# Patient Record
Sex: Male | Born: 1940 | Hispanic: No | Marital: Single | Smoking: Former smoker
Health system: Southern US, Community
[De-identification: ages and names within clinical notes are randomized; demographics above are authoritative.]

## PROBLEM LIST (undated history)

## (undated) DIAGNOSIS — M109 Gout, unspecified: Secondary | ICD-10-CM

## (undated) DIAGNOSIS — I10 Essential (primary) hypertension: Secondary | ICD-10-CM

---

## 2007-01-20 ENCOUNTER — Ambulatory Visit: Payer: Self-pay | Admitting: Family Medicine

## 2009-10-14 ENCOUNTER — Emergency Department (HOSPITAL_COMMUNITY): Admission: EM | Admit: 2009-10-14 | Discharge: 2009-10-14 | Payer: Self-pay | Admitting: Family Medicine

## 2011-04-13 ENCOUNTER — Inpatient Hospital Stay (HOSPITAL_COMMUNITY)
Admission: EM | Admit: 2011-04-13 | Discharge: 2011-04-16 | DRG: 065 | Disposition: A | Discharge status: Home / Self Care | Payer: Medicaid Other | Source: Ambulatory Visit | Attending: Family Medicine | Admitting: Family Medicine

## 2011-04-13 ENCOUNTER — Emergency Department (HOSPITAL_COMMUNITY): Payer: Medicaid Other

## 2011-04-13 ENCOUNTER — Encounter: Payer: Self-pay | Admitting: Family Medicine

## 2011-04-13 DIAGNOSIS — E785 Hyperlipidemia, unspecified: Secondary | ICD-10-CM

## 2011-04-13 DIAGNOSIS — I6350 Cerebral infarction due to unspecified occlusion or stenosis of unspecified cerebral artery: Secondary | ICD-10-CM

## 2011-04-13 DIAGNOSIS — H9190 Unspecified hearing loss, unspecified ear: Secondary | ICD-10-CM | POA: Diagnosis present

## 2011-04-13 DIAGNOSIS — Z66 Do not resuscitate: Secondary | ICD-10-CM | POA: Diagnosis present

## 2011-04-13 DIAGNOSIS — N179 Acute kidney failure, unspecified: Secondary | ICD-10-CM | POA: Diagnosis present

## 2011-04-13 DIAGNOSIS — I10 Essential (primary) hypertension: Secondary | ICD-10-CM

## 2011-04-13 DIAGNOSIS — F17200 Nicotine dependence, unspecified, uncomplicated: Secondary | ICD-10-CM

## 2011-04-13 LAB — PROTIME-INR: Prothrombin Time: 12.2 seconds (ref 11.6–15.2)

## 2011-04-13 LAB — COMPREHENSIVE METABOLIC PANEL
CO2: 27 mEq/L (ref 19–32)
Calcium: 8.7 mg/dL (ref 8.4–10.5)
Creatinine, Ser: 1.44 mg/dL — ABNORMAL HIGH (ref 0.50–1.35)
GFR calc Af Amer: 59 mL/min — ABNORMAL LOW (ref >60)
GFR calc non Af Amer: 49 mL/min — ABNORMAL LOW (ref >60)
Glucose, Bld: 139 mg/dL — ABNORMAL HIGH (ref 70–99)
Sodium: 138 mEq/L (ref 135–145)
Total Protein: 6.4 g/dL (ref 6.0–8.3)

## 2011-04-13 LAB — CBC
HCT: 41.6 % (ref 39.0–52.0)
Hemoglobin: 14.8 g/dL (ref 13.0–17.0)
MCHC: 35.6 g/dL (ref 30.0–36.0)
RDW: 13.6 % (ref 11.5–15.5)
WBC: 14.3 10*3/uL — ABNORMAL HIGH (ref 4.0–10.5)

## 2011-04-13 LAB — DIFFERENTIAL
Basophils Absolute: 0 10*3/uL (ref 0.0–0.1)
Basophils Relative: 0 % (ref 0–1)
Lymphocytes Relative: 21 % (ref 12–46)
Monocytes Absolute: 0.8 10*3/uL (ref 0.1–1.0)
Neutro Abs: 10.4 10*3/uL — ABNORMAL HIGH (ref 1.7–7.7)
Neutrophils Relative %: 73 % (ref 43–77)

## 2011-04-13 LAB — URINE MICROSCOPIC-ADD ON

## 2011-04-13 LAB — URINALYSIS, ROUTINE W REFLEX MICROSCOPIC
Leukocytes, UA: NEGATIVE
Nitrite: NEGATIVE
Specific Gravity, Urine: 1.011 (ref 1.005–1.030)
Urobilinogen, UA: 0.2 mg/dL (ref 0.0–1.0)
pH: 5.5 (ref 5.0–8.0)

## 2011-04-13 LAB — GLUCOSE, CAPILLARY: Glucose-Capillary: 119 mg/dL — ABNORMAL HIGH (ref 70–99)

## 2011-04-13 LAB — POCT I-STAT TROPONIN I: Troponin i, poc: 0 ng/mL (ref 0.00–0.08)

## 2011-04-13 NOTE — H&P (Signed)
Tim [name] is an 70 y.o. male.    PCP:  [facility]  Chief Complaint: R sided weakness x 2 days.  HPI: 70 yo M with known history of HTN presents with 2 days of R sided weakness and numbness. His symptoms have gradually improved, but he still has noticeable weakness. He also reports two months of moderate to severe L sided headache and L hypoacusis. He has been evaluated by his PCP and was recently started on a 6 day course of 60 mg of PO prednisone.  When he weakness started he rested in bed for a while, but presented to the ED for worsening R sided HA and dizziness when he stands. He denies tinnitus, visual disturbances , jaw pain, CP, SOB, N/V, pain with urination, constipation or diarrhea.   PMHx:  HTN Allergies    PSHx: none   FamHx: non-contributory   Social History: Lives with 2 sons, wife in [place]. Smokes 1/2 ppd x 54 yrs. Denies ETOH and IDU.  SonHercules [name]: [phone].  Allergies: NKDA  Meds:    ROS As per HPI, otherwise negative.    Physical Exam  Vitals: 98.2, 148/75 --> 165/95, 98, 21, 98-100% on RA. GEN: Awake alert and NAD HEENT: NCAT, PERRLA, Fundoscopic exam + RR bilaterally, no scleral icterus, b/l TM WNL, L external auditory canal with increased injection but no hyperemia. Nares clear. Poor dentition, MMM, oropharynx clear. Uvula midline.  UJ:WJX9, RRR, no MRGs, 2+ radial and DP pulses  Resp:Normal WOB, CTA b/l.  ABD: Flat, NABS, soft, NT/ND EXT: Warm and dry, no edema  NEURO: A&O x 3, CN II-XII intact except for slight tongue deviation to the R and decreasing hearing (whisper test) in L ear. Normal strength, tone sensation on L side. Decrease strength and sensation on R. 2+ DTR b/l brachioradialis and patellar. No clonus. Romberg and gait testing deferred.   Results for orders placed during the hospital encounter of 04/13/11 (from the past 48 hour(s))  URINALYSIS, ROUTINE W REFLEX MICROSCOPIC     Status: Abnormal   Collection Time   04/13/11  3:17 PM      Component Value Range Comment   Color, Urine YELLOW  YELLOW     Appearance CLEAR  CLEAR     Specific Gravity, Urine 1.011  1.005 - 1.030     pH 5.5  5.0 - 8.0     Glucose, UA NEGATIVE  NEGATIVE (mg/dL)    Hgb urine dipstick NEGATIVE  NEGATIVE     Bilirubin Urine NEGATIVE  NEGATIVE     Ketones, ur NEGATIVE  NEGATIVE (mg/dL)    Protein, ur 147 (*) NEGATIVE (mg/dL)    Urobilinogen, UA 0.2  0.0 - 1.0 (mg/dL)    Nitrite NEGATIVE  NEGATIVE     Leukocytes, UA NEGATIVE  NEGATIVE    URINE MICROSCOPIC-ADD ON     Status: Abnormal   Collection Time   04/13/11  3:17 PM      Component Value Range Comment   Squamous Epithelial / LPF RARE  RARE     WBC, UA 0-2  <3 (WBC/hpf)    RBC / HPF 0-2  <3 (RBC/hpf)    Casts GRANULAR CAST (*) NEGATIVE    DIFFERENTIAL     Status: Abnormal   Collection Time   04/13/11  3:19 PM      Component Value Range Comment   Neutrophils Relative 73  43 - 77 (%)    Neutro Abs 10.4 (*) 1.7 - 7.7 (K/uL)  Lymphocytes Relative 21  12 - 46 (%)    Lymphs Abs 2.9  0.7 - 4.0 (K/uL)    Monocytes Relative 5  3 - 12 (%)    Monocytes Absolute 0.8  0.1 - 1.0 (K/uL)    Eosinophils Relative 1  0 - 5 (%)    Eosinophils Absolute 0.1  0.0 - 0.7 (K/uL)    Basophils Relative 0  0 - 1 (%)    Basophils Absolute 0.0  0.0 - 0.1 (K/uL)   CBC     Status: Abnormal   Collection Time   04/13/11  3:19 PM      Component Value Range Comment   WBC 14.3 (*) 4.0 - 10.5 (K/uL)    RBC 4.81  4.22 - 5.81 (MIL/uL)    Hemoglobin 14.8  13.0 - 17.0 (g/dL)    HCT 16.1  09.6 - 04.5 (%)    MCV 86.5  78.0 - 100.0 (fL)    MCH 30.8  26.0 - 34.0 (pg)    MCHC 35.6  30.0 - 36.0 (g/dL)    RDW 40.9  81.1 - 91.4 (%)    Platelets 314  150 - 400 (K/uL)   PROTIME-INR     Status: Normal   Collection Time   04/13/11  3:19 PM      Component Value Range Comment   Prothrombin Time 12.2  11.6 - 15.2 (seconds)    INR 0.89  0.00 - 1.49    COMPREHENSIVE METABOLIC PANEL     Status: Abnormal    Collection Time   04/13/11  3:19 PM      Component Value Range Comment   Sodium 138  135 - 145 (mEq/L)    Potassium 3.2 (*) 3.5 - 5.1 (mEq/L)    Chloride 101  96 - 112 (mEq/L)    CO2 27  19 - 32 (mEq/L)    Glucose, Bld 139 (*) 70 - 99 (mg/dL)    BUN 31 (*) 6 - 23 (mg/dL)    Creatinine, Ser 7.82 (*) 0.50 - 1.35 (mg/dL)    Calcium 8.7  8.4 - 10.5 (mg/dL)    Total Protein 6.4  6.0 - 8.3 (g/dL)    Albumin 3.1 (*) 3.5 - 5.2 (g/dL)    AST 21  0 - 37 (U/L)    ALT 43  0 - 53 (U/L)    Alkaline Phosphatase 73  39 - 117 (U/L)    Total Bilirubin 0.6  0.3 - 1.2 (mg/dL)    GFR calc non Af Amer 49 (*) >60 (mL/min)    GFR calc Af Amer 59 (*) >60 (mL/min)   POCT I-STAT TROPONIN I     Status: Normal   Collection Time   04/13/11  3:50 PM      Component Value Range Comment   Troponin i, poc 0.00  0.00 - 0.08 (ng/mL)    Comment 3            GLUCOSE, CAPILLARY     Status: Abnormal   Collection Time   04/13/11  4:14 PM      Component Value Range Comment   Glucose-Capillary 119 (*) 70 - 99 (mg/dL)   CK TOTAL AND CKMB     Status: Normal   Collection Time   04/13/11  8:29 PM      Component Value Range Comment   Total CK 65  7 - 232 (U/L)    CK, MB 2.9  0.3 - 4.0 (ng/mL)    Relative Index RELATIVE  INDEX IS INVALID  0.0 - 2.5    TROPONIN I     Status: Normal   Collection Time   04/13/11  8:29 PM      Component Value Range Comment   Troponin I <0.30  <0.30 (ng/mL)    Dg Chest 2 View  04/13/2011  *RADIOLOGY REPORT*  Clinical Data: Chest pain, weakness.  CHEST - 2 VIEW  Comparison: None  Findings: Minimal bibasilar atelectasis.  Otherwise lungs are clear.  Heart is normal size.  No effusions.  No acute bony abnormality.  IMPRESSION: Bibasilar atelectasis.  Original Report Authenticated By: Cyndie Chime, M.D.   Ct Head Wo Contrast  04/13/2011  *RADIOLOGY REPORT*  Clinical Data: Headaches with left-sided weakness.  History of hypertension.  CT HEAD WITHOUT CONTRAST  Technique:  Contiguous axial images  were obtained from the base of the skull through the vertex without contrast.  Comparison: None.  Findings: There is no evidence of acute intracranial hemorrhage, mass lesion, brain edema or extra-axial fluid collection.  There is well defined low density in the left basal ganglia consistent with old chronic lacunar infarcts.  In addition, smaller less well defined foci of low density are seen within the left basal ganglia and thalamus (images 12 and 13). There is no evidence of cortical infarct or hydrocephalus.  The visualized paranasal sinuses are clear.  The calvarium is intact.  IMPRESSION:  1.  Multifocal small vessel ischemic changes are present within the left basal ganglia and thalamus.  Some of these are ill-defined and potentially subacute - correlate clinically. 2.  No evidence of cortical infarct, brain edema or acute hemorrhage.  Original Report Authenticated By: Gerrianne Scale, M.D.    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  1. Stroke: admit for work-up. ASA for anti-platelet agent. Will check A1c and FLP to guide secondary prevention strategies. Evaluate clot burden/vascular obstruction with cardiac ECHO and carotid doppler. Have considered obtaining an MRI to further evaluate brainstem for lesions/evidence of ischemia given complaint of unilateral hearing loss and dizziness.   2. Renal insufficiency: Cr elevated. No previous labs to trend. Will hydrate and recheck in AM.   3. HTN: Elevated BP. Will restart home HCTz-lisinopril. Will not treat BP aggressively given recent neurological ischemia.   4. FEN/GI: K+ low. Will replete in IVFs. Pt has passed bedside swallow study and may have a heart healthy diet.   5.  DVT PPX: Lovenox  6. Dispo: pending stroke work-up to home. Anticipate 2-3 day hospital stay.   Alanmichael Barmore 04/13/2011, 11:21 PM

## 2011-04-14 ENCOUNTER — Inpatient Hospital Stay (HOSPITAL_COMMUNITY): Payer: Medicaid Other

## 2011-04-14 LAB — DIFFERENTIAL
Eosinophils Absolute: 0.3 10*3/uL (ref 0.0–0.7)
Lymphs Abs: 3 10*3/uL (ref 0.7–4.0)
Monocytes Absolute: 0.7 10*3/uL (ref 0.1–1.0)
Monocytes Relative: 6 % (ref 3–12)
Neutrophils Relative %: 68 % (ref 43–77)

## 2011-04-14 LAB — BASIC METABOLIC PANEL
BUN: 33 mg/dL — ABNORMAL HIGH (ref 6–23)
GFR calc Af Amer: >60 mL/min (ref >60)
GFR calc non Af Amer: 51 mL/min — ABNORMAL LOW (ref >60)
Potassium: 4.3 mEq/L (ref 3.5–5.1)
Sodium: 143 mEq/L (ref 135–145)

## 2011-04-14 LAB — CBC
Hemoglobin: 13.3 g/dL (ref 13.0–17.0)
MCH: 30.4 pg (ref 26.0–34.0)
MCV: 87.9 fL (ref 78.0–100.0)
Platelets: 301 10*3/uL (ref 150–400)
RBC: 4.37 MIL/uL (ref 4.22–5.81)

## 2011-04-14 LAB — LIPID PANEL
Cholesterol: 221 mg/dL — ABNORMAL HIGH (ref 0–200)
HDL: 47 mg/dL (ref >39)
Triglycerides: 527 mg/dL — ABNORMAL HIGH (ref <150)

## 2011-04-14 LAB — HEMOGLOBIN A1C
Hgb A1c MFr Bld: 6 % — ABNORMAL HIGH (ref <5.7)
Mean Plasma Glucose: 126 mg/dL — ABNORMAL HIGH (ref <117)

## 2011-04-14 LAB — GLUCOSE, CAPILLARY: Glucose-Capillary: 93 mg/dL (ref 70–99)

## 2011-04-15 DIAGNOSIS — G459 Transient cerebral ischemic attack, unspecified: Secondary | ICD-10-CM

## 2011-04-15 LAB — CBC
HCT: 40.4 % (ref 39.0–52.0)
MCV: 88 fL (ref 78.0–100.0)
RBC: 4.59 MIL/uL (ref 4.22–5.81)
WBC: 12.6 10*3/uL — ABNORMAL HIGH (ref 4.0–10.5)

## 2011-04-15 LAB — GLUCOSE, CAPILLARY
Glucose-Capillary: 117 mg/dL — ABNORMAL HIGH (ref 70–99)
Glucose-Capillary: 139 mg/dL — ABNORMAL HIGH (ref 70–99)

## 2011-04-15 LAB — BASIC METABOLIC PANEL
Chloride: 105 mEq/L (ref 96–112)
Creatinine, Ser: 1.3 mg/dL (ref 0.50–1.35)
GFR calc Af Amer: >60 mL/min (ref >60)

## 2011-04-15 LAB — LIPID PANEL
Cholesterol: 241 mg/dL — ABNORMAL HIGH (ref 0–200)
Total CHOL/HDL Ratio: 5 RATIO

## 2011-04-16 LAB — CBC
MCHC: 33.7 g/dL (ref 30.0–36.0)
Platelets: 289 10*3/uL (ref 150–400)
RDW: 14.1 % (ref 11.5–15.5)

## 2011-04-16 LAB — BASIC METABOLIC PANEL
GFR calc Af Amer: 50 mL/min — ABNORMAL LOW (ref >60)
GFR calc non Af Amer: 41 mL/min — ABNORMAL LOW (ref >60)
Potassium: 4.1 mEq/L (ref 3.5–5.1)
Sodium: 140 mEq/L (ref 135–145)

## 2011-04-16 LAB — GLUCOSE, CAPILLARY: Glucose-Capillary: 107 mg/dL — ABNORMAL HIGH (ref 70–99)

## 2011-04-20 ENCOUNTER — Other Ambulatory Visit: Payer: Self-pay | Admitting: Otolaryngology

## 2011-04-24 NOTE — H&P (Signed)
NAMEHAFIZ, Tim [name]                     ACCOUNT NO.:  [id number]  MEDICAL RECORD NO.:  [id number]  LOCATION:  3038                         FACILITY:  [facility]  PHYSICIAN:  Kamica Florance A. Sheffield Slider, M.D.    DATE OF BIRTH:  1941-06-01  DATE OF ADMISSION:  04/13/2011 DATE OF DISCHARGE:                             HISTORY & PHYSICAL   PRIMARY CARE PHYSICIAN:  [facility].  CHIEF COMPLAINT:  Right-sided weakness x2 days.  HISTORY OF PRESENT ILLNESS:  The patient is a 70 year old male with a known history of hypertension who presents with 2 days of right-sided weakness and numbness.  His symptoms have gradually improved, but he still has noticeable weakness on his right side.  He also reports 2 months of moderate-to-severe left-sided headache and left hypacusis.  He has been evaluated by his PCP who has recently started on a 6-day course of 50 mg prednisone.  On April 04, 2011, when his weakness started, he rested on the bed for a while at home, but presented to the ED today. He has worsening right-sided headache and dizziness when he stands.  He denies tinnitus, visual disturbances, jaw pain, chest pain, shortness of breath, nausea, and vomiting, pain with urination, constipation, or diarrhea.  PAST MEDICAL HISTORY:  Significant for hypertension and allergy.  PAST SURGICAL HISTORY:  None.  FAMILY HISTORY:  Noncontributory.  SOCIAL HISTORY:  The patient lives with his 2 sons.  His wife is still at [place].  He smokes a half pack per day x45 years.  He denies EtOH and illicit drug use.  His son, Isaack Preble, left his cell phone number and would like to be updated.  His number is [phone].  The patient is [place] and he speaks Counselling psychologist.  ALLERGIES:  No known drug allergies.  MEDICATIONS: 1. Hydrochlorothiazide/lisinopril 12.5/10 mg p.o. daily. 2. Claritin daily. 3. Prednisone 50 mg p.o. daily x6 days completed a course on April 11, 2011.  REVIEW OF SYSTEMS:   As per HPI, otherwise negative.  PHYSICAL EXAMINATION:  VITAL SIGNS:  98.2, blood pressure 140/75 to 155/95, pulse 98, respiratory rate 21, O2 sats 98-100% on room air. GENERAL:  The patient is awake, alert, in no acute distress. HEENT:  NCAT, PERRLA, funduscopic exam positive red reflex bilaterally. No papilledema.  No scleral icterus.  Bilateral tympanic membranes within normal limits.  Left external auditory canal with increased injection but no hyperemia.  Nares clear.  Poor dentition.  Moist mucous membranes.  Oropharynx clear.  Uvula midline. CARDIOVASCULAR:  S1, S2.  Regular rate and rhythm.  No murmurs, rubs, or gallops.  2+ radial and DP pulses. RESPIRATORY:  Normal work of breathing.  Clear to auscultation bilaterally. ABDOMEN:  Flat, normoactive bowel sounds.  Soft, nontender, nondistended. EXTREMITIES:  Warm and dry.  No edema. NEURO:  Alert and oriented x3.  Cranial nerves II-XII intact except for slight tongue deviation to the right and decreased hearing as evaluated by whisper test in his left ear.  Normal strength, tone, sensation on left side.  Decreased strength and sensation on the right.  2+ DTRs in bilateral brachioradialis and patellar.  No clonus.  Romberg and gait testing deferred.  PERTINENT LABORATORY DATA:  UA within normal limits except for 100 protein.  Urine micro shows rare squamous cells and granular casts. CBC:  White count 14.3, hemoglobin 14.8, hematocrit 41.6, platelets 314. INR 0.89.  BMET:  Potassium low at 3.2, albumin 3.1, glucose is 139, BUN 31, creatinine elevated at 1.44.  Point-of-care cardiac enzymes negative.  Chest x-ray shows bibasilar atelectasis, otherwise normal. CT of the head positive for multifocal small vessel ischemic changes in the left basal ganglia and thalamus.  Some of the areas are ill defined and potentially subacute.  No evidence of cortical infarct, brain edema, or acute hemorrhage.  ASSESSMENT AND PLAN:  70 year old  gentleman with left-sided basal ganglia and thalamic stroke. 1. Stroke.  Admit for workup.  Aspirin for antiplatelet agent.  We     will check A1c and fasting lipid panel secondary prevention strategies.     Evaluate clot burden/vascular obstruction with cardiac echo and     carotid Doppler.  I have also considered obtaining an MRI to     further evaluate brainstem for lesions.  There was evidence of     ischemia given complaint of unilateral hearing loss and dizziness     during this admission. 2. Renal insufficiency.  Creatinine elevated.  No previous labs to     trend.  We will hydrate and recheck creatinine in the morning. 3. Hypertension.  Elevated blood pressure.  We will restart home     hydrochlorothiazide/lisinopril.  We will not treat blood pressure     aggressively given recent neurological ischemia. 4. Fluids, electrolytes, nutrition/gastrointestinal.  Potassium low.     We will replete and give IV fluids.  The patient has passed bedside     swallow study and may have a heart-healthy diet. 5. Deep vein thrombosis prophylaxis.  Lovenox subcu. 6. Disposition.  Pending stroke workup, to home, anticipate 2- to 3-     day hospital stay.    ______________________________ Dessa Phi, MD   ______________________________ Arnette Norris. Sheffield Slider, M.D.    JF/MEDQ  D:  04/14/2011  T:  04/14/2011  Job:  [id number]  Electronically Signed by Dessa Phi MD on 04/15/2011 11:28:34 PM Electronically Signed by Zachery Dauer M.D. on 04/24/2011 05:14:39 AM

## 2011-04-25 ENCOUNTER — Ambulatory Visit: Payer: Medicaid Other | Attending: Orthopedic Surgery | Admitting: Rehabilitative and Restorative Service Providers"

## 2011-04-25 DIAGNOSIS — R293 Abnormal posture: Secondary | ICD-10-CM | POA: Insufficient documentation

## 2011-04-25 DIAGNOSIS — M25619 Stiffness of unspecified shoulder, not elsewhere classified: Secondary | ICD-10-CM | POA: Insufficient documentation

## 2011-04-25 DIAGNOSIS — IMO0001 Reserved for inherently not codable concepts without codable children: Secondary | ICD-10-CM | POA: Insufficient documentation

## 2011-04-25 DIAGNOSIS — M25519 Pain in unspecified shoulder: Secondary | ICD-10-CM | POA: Insufficient documentation

## 2011-05-09 NOTE — Discharge Summary (Signed)
Tim [name], Tim [name]                     ACCOUNT NO.:  [id number]  MEDICAL RECORD NO.:  [id number]  LOCATION:  3038                         FACILITY:  [facility]  PHYSICIAN:  Lavonne Kinderman A. Sheffield Slider, M.D.    DATE OF BIRTH:  1941/01/15  DATE OF ADMISSION:  04/13/2011 DATE OF DISCHARGE:  04/16/2011                              DISCHARGE SUMMARY   PRIMARY CARE PHYSICIAN:  [facility] in [place].  SPECIALIST:  Otolaryngologist, Suzanna Obey, MD, at [facility].  CONSULTATIONS DURING INPATIENT STAY:  Physical Therapy, Occupational Therapy, and Speech Pathology.  PROCEDURES:  None.  ADMISSION DIAGNOSIS:  Acute onset right-sided weakness.  DISCHARGE DIAGNOSES: 1. Cerebrovascular attack - acute infarct of the left posterior limb     of the internal capsule. 2. Hypertension. 3. Left-sided hearing loss. 4. Hyperlipidemia.  HOSPITAL COURSE: 1. Mr. Cull is a 70 year old male with past medical history of     hypertension and tobacco use, who presented with severe left-sided     headache and 2-day history of right-sided weakness.  Additionally,     he had left-sided hearing loss of 2 months' duration.  Given the     acute onset of right-sided weakness, the patient was admitted for a     stroke workup and he was started on antiplatelet therapy and     aspirin 325 mg.  A CT of the head performed on the day of admission     showed multifocal small-vessel ischemic changes present within the     left basal ganglia and thalamus.  There was no evidence of cortical     infarct, brain edema, or acute hemorrhage.  However, an MRI on day     #2 of admission showed an acute infarct of the posterior limb of     the internal capsule.  There was a chronic infarct of the left     external capsule anteriorly as well as chronic lacunar infarcts in     the left putamen.  Brainstem and cerebellum were intact, negative     for hemorrhage, mass, or lesions.  At the same time, an MR     angiogram was  performed that was negative for aneurysm.  For     further stroke workup, a carotid ultrasound was performed and     showed no stenosis.  Additionally, echocardiogram was performed     that was negative for any valvular abnormalities or thrombus.  By     day #2 of admission, the patient's right-sided weakness that     started to resolve, however, the patient continued to note     decreased left-sided hearing as well as severe left-sided headache.     The patient was seen by Speech Pathology.  It was noted that he     passed the swallowing study, however, he had poor hearing in his     left ear and recommended a consultation with Audiology.  The     patient was seen by Occupational Therapy and did not think that he     needed any occupational therapy in an outpatient setting.  The     patient was  also seen by Physical Therapy and noted that he got     dizzy with ambulation, was concerned about vestibular disturbance     and recommended outpatient physical therapy for vesicular rehab.     By hospital day #3, the patient's right-sided weakness had     completely resolved, however, his headache was still present, which     was treated with acetaminophen and oxycodone p.r.n. 2. Acute kidney injury.  Upon admission, the patient was noted to have     a creatinine of 1.5, so he was given IV hydration and creatinine     decreased to 1.3 at discharge. 3. Hyperlipidemia.  The patient's fasting lipid profile showed total     cholesterol of 241, triglycerides of 437, HDL 48 and LDL could not     be calculated based on the level of triglycerides, however, the     patient was started on simvastatin 40 mg p.o. daily out of concern     for hyperlipidemia.  PHYSICAL EXAMINATION:  At discharge, the patient has 5/5 strength in the upper and lower extremity on the right side and was equal bilaterally. It is noteworthy for still having left-sided hearing loss, however, an ear exam revealed no obstruction, no  cerumen impaction, mild erythema of the external auditory canal, and no deformity of the tympanic membrane. The patient's left-sided headache was still present as well as dizziness with ambulation.  DISCHARGE MEDICATIONS: 1. Aspirin 325 mg by mouth daily. 2. Oxycodone 5 mg by mouth twice daily as needed for headache. 3. Simvastatin 40 mg by mouth daily. 4. Lisinopril/hydrochlorothiazide 10/12.5 mg 1 tablet by mouth daily. 5. Loratadine 10 mg 1 tablet by mouth daily.  DISPOSITION:  The patient was discharged home.  FOLLOWUP:  The patient will follow up with Dr. Jearld Fenton at [facility] on Wednesday, April 18, 2011, at 2:30 p.m. in the afternoon.  The patient will follow up with his PCP, [facility], within 10 days.  FOLLOWUP ISSUES: 1. Investigation of left-sided hearing loss. 2. Stroke prevention including smoking cessation, hyperlipidemia     management, and hypertension management.    ______________________________ Mat Carne, MD   ______________________________ Arnette Norris. Sheffield Slider, M.D.    EW/MEDQ  D:  04/17/2011  T:  04/17/2011  Job:  [id number]  cc:   [facility] Suzanna Obey, M.D.  Electronically Signed by Mat Carne  on 04/30/2011 04:01:24 PM Electronically Signed by Zachery Dauer M.D. on 05/09/2011 10:02:12 PM

## 2013-06-26 ENCOUNTER — Inpatient Hospital Stay (HOSPITAL_COMMUNITY)
Admission: EM | Admit: 2013-06-26 | Discharge: 2013-07-01 | DRG: 064 | Disposition: A | Discharge status: Home Health | Payer: Medicare Other | Attending: Family Medicine | Admitting: Family Medicine

## 2013-06-26 ENCOUNTER — Emergency Department (HOSPITAL_COMMUNITY): Payer: Medicare Other

## 2013-06-26 ENCOUNTER — Encounter (HOSPITAL_COMMUNITY): Payer: Self-pay | Admitting: Emergency Medicine

## 2013-06-26 ENCOUNTER — Inpatient Hospital Stay (HOSPITAL_COMMUNITY): Payer: Medicare Other

## 2013-06-26 DIAGNOSIS — I129 Hypertensive chronic kidney disease with stage 1 through stage 4 chronic kidney disease, or unspecified chronic kidney disease: Secondary | ICD-10-CM | POA: Diagnosis present

## 2013-06-26 DIAGNOSIS — Z79899 Other long term (current) drug therapy: Secondary | ICD-10-CM

## 2013-06-26 DIAGNOSIS — G8190 Hemiplegia, unspecified affecting unspecified side: Secondary | ICD-10-CM | POA: Diagnosis present

## 2013-06-26 DIAGNOSIS — R4189 Other symptoms and signs involving cognitive functions and awareness: Secondary | ICD-10-CM | POA: Diagnosis present

## 2013-06-26 DIAGNOSIS — R531 Weakness: Secondary | ICD-10-CM

## 2013-06-26 DIAGNOSIS — I672 Cerebral atherosclerosis: Secondary | ICD-10-CM | POA: Diagnosis present

## 2013-06-26 DIAGNOSIS — F0150 Vascular dementia without behavioral disturbance: Secondary | ICD-10-CM | POA: Diagnosis present

## 2013-06-26 DIAGNOSIS — I6350 Cerebral infarction due to unspecified occlusion or stenosis of unspecified cerebral artery: Principal | ICD-10-CM | POA: Diagnosis present

## 2013-06-26 DIAGNOSIS — H532 Diplopia: Secondary | ICD-10-CM | POA: Diagnosis present

## 2013-06-26 DIAGNOSIS — G9340 Encephalopathy, unspecified: Secondary | ICD-10-CM | POA: Diagnosis present

## 2013-06-26 DIAGNOSIS — I10 Essential (primary) hypertension: Secondary | ICD-10-CM

## 2013-06-26 DIAGNOSIS — I639 Cerebral infarction, unspecified: Secondary | ICD-10-CM | POA: Diagnosis present

## 2013-06-26 DIAGNOSIS — R262 Difficulty in walking, not elsewhere classified: Secondary | ICD-10-CM | POA: Diagnosis present

## 2013-06-26 DIAGNOSIS — M109 Gout, unspecified: Secondary | ICD-10-CM | POA: Diagnosis present

## 2013-06-26 DIAGNOSIS — R4789 Other speech disturbances: Secondary | ICD-10-CM | POA: Diagnosis present

## 2013-06-26 DIAGNOSIS — Z8673 Personal history of transient ischemic attack (TIA), and cerebral infarction without residual deficits: Secondary | ICD-10-CM

## 2013-06-26 DIAGNOSIS — H9190 Unspecified hearing loss, unspecified ear: Secondary | ICD-10-CM | POA: Diagnosis present

## 2013-06-26 DIAGNOSIS — N183 Chronic kidney disease, stage 3 unspecified: Secondary | ICD-10-CM | POA: Diagnosis present

## 2013-06-26 DIAGNOSIS — I498 Other specified cardiac arrhythmias: Secondary | ICD-10-CM | POA: Diagnosis not present

## 2013-06-26 DIAGNOSIS — N179 Acute kidney failure, unspecified: Secondary | ICD-10-CM | POA: Diagnosis present

## 2013-06-26 DIAGNOSIS — E785 Hyperlipidemia, unspecified: Secondary | ICD-10-CM | POA: Diagnosis present

## 2013-06-26 DIAGNOSIS — F17200 Nicotine dependence, unspecified, uncomplicated: Secondary | ICD-10-CM | POA: Diagnosis present

## 2013-06-26 HISTORY — DX: Essential (primary) hypertension: I10

## 2013-06-26 LAB — COMPREHENSIVE METABOLIC PANEL
ALT: 11 U/L (ref 0–53)
AST: 15 U/L (ref 0–37)
Alkaline Phosphatase: 112 U/L (ref 39–117)
BUN: 24 mg/dL — ABNORMAL HIGH (ref 6–23)
Calcium: 9.1 mg/dL (ref 8.4–10.5)
Creatinine, Ser: 2.01 mg/dL — ABNORMAL HIGH (ref 0.50–1.35)
GFR calc Af Amer: 37 mL/min — ABNORMAL LOW (ref >90)
GFR calc non Af Amer: 32 mL/min — ABNORMAL LOW (ref >90)
Total Bilirubin: 1 mg/dL (ref 0.3–1.2)
Total Protein: 7.7 g/dL (ref 6.0–8.3)

## 2013-06-26 LAB — CBC WITH DIFFERENTIAL/PLATELET
Basophils Absolute: 0 10*3/uL (ref 0.0–0.1)
Basophils Relative: 0 % (ref 0–1)
Eosinophils Absolute: 0.2 10*3/uL (ref 0.0–0.7)
Eosinophils Relative: 2 % (ref 0–5)
Lymphocytes Relative: 19 % (ref 12–46)
Lymphs Abs: 2.1 10*3/uL (ref 0.7–4.0)
Neutrophils Relative %: 75 % (ref 43–77)

## 2013-06-26 LAB — CBC
Hemoglobin: 15.7 g/dL (ref 13.0–17.0)
MCH: 30.5 pg (ref 26.0–34.0)
MCHC: 34.5 g/dL (ref 30.0–36.0)
MCV: 88.3 fL (ref 78.0–100.0)
Platelets: 326 10*3/uL (ref 150–400)
RDW: 13.4 % (ref 11.5–15.5)

## 2013-06-26 LAB — URINE MICROSCOPIC-ADD ON

## 2013-06-26 LAB — CREATININE, SERUM: Creatinine, Ser: 1.99 mg/dL — ABNORMAL HIGH (ref 0.50–1.35)

## 2013-06-26 LAB — URINALYSIS, ROUTINE W REFLEX MICROSCOPIC
Leukocytes, UA: NEGATIVE
Nitrite: NEGATIVE
Specific Gravity, Urine: 1.024 (ref 1.005–1.030)

## 2013-06-26 LAB — TROPONIN I: Troponin I: <0.3 ng/mL (ref <0.30)

## 2013-06-26 LAB — HEMOGLOBIN A1C: Hgb A1c MFr Bld: 5.8 % — ABNORMAL HIGH (ref <5.7)

## 2013-06-26 LAB — PRO B NATRIURETIC PEPTIDE: Pro B Natriuretic peptide (BNP): 175.8 pg/mL — ABNORMAL HIGH (ref 0–125)

## 2013-06-26 MED ORDER — AMLODIPINE BESYLATE 10 MG PO TABS: 10.0000 mg | ORAL_TABLET | Freq: Every day | ORAL | Status: DC

## 2013-06-26 MED ORDER — AMLODIPINE BESYLATE 5 MG PO TABS
5.0000 mg | ORAL_TABLET | Freq: Every day | ORAL | Status: DC
  Administered 2013-06-26 – 2013-06-27 (×2): 5 mg via ORAL
  Filled 2013-06-26 (×3): qty 1

## 2013-06-26 MED ORDER — HEPARIN SODIUM (PORCINE) 5000 UNIT/ML IJ SOLN
5000.0000 [IU] | Freq: Three times a day (TID) | INTRAMUSCULAR | Status: DC
  Administered 2013-06-26 – 2013-07-01 (×15): 5000 [IU] via SUBCUTANEOUS
  Filled 2013-06-26 (×17): qty 1

## 2013-06-26 MED ORDER — ASPIRIN EC 81 MG PO TBEC
81.0000 mg | DELAYED_RELEASE_TABLET | Freq: Every day | ORAL | Status: DC
  Administered 2013-06-26 – 2013-06-27 (×2): 81 mg via ORAL
  Filled 2013-06-26 (×2): qty 1

## 2013-06-26 MED ORDER — SODIUM CHLORIDE 0.9 % IV SOLN: INTRAVENOUS | Status: DC

## 2013-06-26 MED ORDER — SENNOSIDES-DOCUSATE SODIUM 8.6-50 MG PO TABS: 1.0000 | ORAL_TABLET | Freq: Every evening | ORAL | Status: DC | PRN

## 2013-06-26 MED ORDER — SODIUM CHLORIDE 0.9 % IV SOLN
INTRAVENOUS | Status: DC
  Administered 2013-06-26: 14:00:00 via INTRAVENOUS
  Administered 2013-06-30: 500 mL via INTRAVENOUS

## 2013-06-26 MED ORDER — ACETAMINOPHEN 325 MG PO TABS
650.0000 mg | ORAL_TABLET | ORAL | Status: DC | PRN
  Administered 2013-06-26 – 2013-06-30 (×2): 650 mg via ORAL
  Filled 2013-06-26 (×2): qty 2

## 2013-06-26 NOTE — Consult Note (Signed)
NEURO HOSPITALIST CONSULT NOTE    Reason for Consult: right hemiparesis and HA  HPI:                                                                                                                                          Tim [name] is an 72 y.o. male with a past medical history significant for HTN, heavy smoking, brought to [facility] ED by family due to new onset HA and right sided weakness. Patient doesn't speak English and all information is provided by family who expressed taht Tim [name] started complaining of HA 2 days ago and then became " paralyzed in the right side" to the degree that walking has been a major struggle and he had a fall. No reported vertigo, double vision, slurred speech, language or vision impairment. 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  Past Medical History  Diagnosis Date  . Hypertension     History reviewed. No pertinent past surgical history.  History reviewed. No pertinent family history.  Social History:  reports that he has been smoking.  He does not have any smokeless tobacco history on file. He reports that he does not drink alcohol. His drug history is not on file.  No Known Allergies  MEDICATIONS:                                                                                                                     I have reviewed the patient's current medications.   ROS: unable to obtain due to language barrier.  History obtained from chart review and family.   Physical exam: pleasant male in no apparent distress. Blood pressure 158/101, pulse 84, temperature 98.6 F (37 C), resp. rate 26, SpO2 96.00%. Head: normocephalic. Neck: supple, no bruits, no JVD. Cardiac: no murmurs. Lungs: clear. Abdomen: soft, no tender, no mass. Extremities: no edema.  Neurologic Examination:                                                                                                       Mental Status: Alert, awake, oriented, thought content appropriate.  Speech fluent without evidence of aphasia.  Able to follow 3 step commands without difficulty. Cranial Nerves: II: Discs flat bilaterally; Visual fields grossly normal, pupils equal, round, reactive to light and accommodation III,IV, VI: ptosis not present, extra-ocular motions intact bilaterally V,VII: smile symmetric, facial light touch sensation normal bilaterally VIII: hearing normal bilaterally IX,X: gag reflex present XI: bilateral shoulder shrug XII: midline tongue extension without atrophy or fasciculations  Motor: Mild right HP Tone and bulk:normal tone throughout; no atrophy noted Sensory: Pinprick and light touch intact throughout impaired in the right side. Deep Tendon Reflexes:  Right: Upper Extremity   Left: Upper extremity   biceps (C-5 to C-6) 2/4   biceps (C-5 to C-6) 2/4 tricep (C7) 2/4    triceps (C7) 2/4 Brachioradialis (C6) 2/4  Brachioradialis (C6) 2/4  Lower Extremity Lower Extremity  quadriceps (L-2 to L-4) 2/4   quadriceps (L-2 to L-4) 2/4 Achilles (S1) 2/4   Achilles (S1) 2/4  Plantars: Right: downgoing   Left: downgoing Cerebellar: normal finger-to-nose,  normal heel-to-shin test Gait:  No tested. CV: pulses palpable throughout    No results found for this basename: cbc, bmp, coags, chol, tri, ldl, hga1c    Results for orders placed during the hospital encounter of 06/26/13 (from the past 48 hour(s))  GLUCOSE, CAPILLARY     Status: Abnormal   Collection Time    06/26/13 11:53 AM      Result Value Range   Glucose-Capillary 134 (*) 70 - 99 mg/dL  COMPREHENSIVE METABOLIC PANEL     Status: Abnormal   Collection Time    06/26/13 12:05 PM      Result Value Range   Sodium 139  135 - 145 mEq/L   Potassium 3.7  3.5 - 5.1 mEq/L   Chloride 102  96 - 112 mEq/L   CO2 25  19 - 32 mEq/L    Glucose, Bld 100 (*) 70 - 99 mg/dL   BUN 24 (*) 6 - 23 mg/dL   Creatinine, Ser 4.09 (*) 0.50 - 1.35 mg/dL   Calcium 9.1  8.4 - 81.1 mg/dL   Total Protein 7.7  6.0 - 8.3 g/dL   Albumin 3.5  3.5 - 5.2 g/dL   AST 15  0 - 37 U/L   ALT 11  0 - 53 U/L   Alkaline Phosphatase 112  39 - 117 U/L   Total Bilirubin 1.0  0.3 - 1.2 mg/dL   GFR calc non Af Amer 32 (*) >90 mL/min   GFR calc  Af Amer 37 (*) >90 mL/min   Comment: (NOTE)     The eGFR has been calculated using the CKD EPI equation.     This calculation has not been validated in all clinical situations.     eGFR's persistently <90 mL/min signify possible Chronic Kidney     Disease.  CBC WITH DIFFERENTIAL     Status: Abnormal   Collection Time    06/26/13 12:05 PM      Result Value Range   WBC 11.0 (*) 4.0 - 10.5 K/uL   RBC 5.51  4.22 - 5.81 MIL/uL   Hemoglobin 16.7  13.0 - 17.0 g/dL   HCT 16.1  09.6 - 04.5 %   MCV 86.8  78.0 - 100.0 fL   MCH 30.3  26.0 - 34.0 pg   MCHC 34.9  30.0 - 36.0 g/dL   RDW 40.9  81.1 - 91.4 %   Platelets 337  150 - 400 K/uL   Neutrophils Relative % 75  43 - 77 %   Neutro Abs 8.2 (*) 1.7 - 7.7 K/uL   Lymphocytes Relative 19  12 - 46 %   Lymphs Abs 2.1  0.7 - 4.0 K/uL   Monocytes Relative 5  3 - 12 %   Monocytes Absolute 0.5  0.1 - 1.0 K/uL   Eosinophils Relative 2  0 - 5 %   Eosinophils Absolute 0.2  0.0 - 0.7 K/uL   Basophils Relative 0  0 - 1 %   Basophils Absolute 0.0  0.0 - 0.1 K/uL  PRO B NATRIURETIC PEPTIDE     Status: Abnormal   Collection Time    06/26/13 12:05 PM      Result Value Range   Pro B Natriuretic peptide (BNP) 175.8 (*) 0 - 125 pg/mL  URINALYSIS, ROUTINE W REFLEX MICROSCOPIC     Status: Abnormal   Collection Time    06/26/13  1:23 PM      Result Value Range   Color, Urine YELLOW  YELLOW   APPearance CLEAR  CLEAR   Specific Gravity, Urine 1.024  1.005 - 1.030   pH 6.0  5.0 - 8.0   Glucose, UA NEGATIVE  NEGATIVE mg/dL   Hgb urine dipstick SMALL (*) NEGATIVE   Bilirubin Urine  NEGATIVE  NEGATIVE   Ketones, ur NEGATIVE  NEGATIVE mg/dL   Protein, ur >782 (*) NEGATIVE mg/dL   Urobilinogen, UA 0.2  0.0 - 1.0 mg/dL   Nitrite NEGATIVE  NEGATIVE   Leukocytes, UA NEGATIVE  NEGATIVE  URINE MICROSCOPIC-ADD ON     Status: Abnormal   Collection Time    06/26/13  1:23 PM      Result Value Range   WBC, UA 0-2  <3 WBC/hpf   Bacteria, UA RARE  RARE   Casts HYALINE CASTS (*) NEGATIVE   Comment: GRANULAR CAST    Dg Chest 2 View  06/26/2013   CLINICAL DATA:  72 year old male with headache dizziness weakness. Initial encounter.  EXAM: CHEST  2 VIEW  COMPARISON:  None.  FINDINGS: Mildly low lung volumes. Cardiac size at the upper limits of normal. Other mediastinal contours are within normal limits. Visualized tracheal air column is within normal limits. No pneumothorax, pulmonary edema, pleural effusion or confluent pulmonary opacity. Small benign appearing chondroid matrix type lesion in the proximal right humerus meta diaphysis. No acute osseous abnormality identified.  IMPRESSION: 1. No acute cardiopulmonary abnormality.  2. 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   Electronically Signed   By:  Augusto Gamble M.D.   On: 06/26/2013 13:29   Ct Head (brain) Wo Contrast  06/26/2013   CLINICAL DATA:  72 year old male with weakness numbness on the right side headache and blurred vision. Initial encounter.  EXAM: CT HEAD WITHOUT CONTRAST  TECHNIQUE: Contiguous axial images were obtained from the base of the skull through the vertex without intravenous contrast.  COMPARISON:  None.  FINDINGS: Visualized paranasal sinuses and mastoids are clear. No acute osseous abnormality identified. Visualized orbits and scalp soft tissues are within normal limits.  Cortical and white matter hypodensity in the left PCA territory primarily affecting the left occipital lobe. There is mild associated mass effect, such as on the left occipital horn. No associated hemorrhage is identified.  Elsewhere there  are chronic appearing lacunar infarcts in the bilateral deep gray matter nuclei, and nearby white matter capsules.  No ventriculomegaly. No midline shift. Basilar cisterns are patent. No acute intracranial hemorrhage identified. No suspicious intracranial vascular hyperdensity.  IMPRESSION: 1. Subacute appearing left PCA infarct. Minimal to mild mass effect. No associated hemorrhage.  2.  Underlying advanced chronic small vessel ischemia.   Electronically Signed   By: Augusto Gamble M.D.   On: 06/26/2013 11:49   Assessment/Plan: 72 y/o with recent onset HA and mild right hemiparesis. CT brain confirms a subacute left PCA distribution infarct. Recommend: Admit to medicine and complete stroke work up. Aspirin 81 mg daily.    Wyatt Portela, MD 06/26/2013, 4:46 PM

## 2013-06-26 NOTE — ED Provider Notes (Addendum)
CSN: [id number]     Arrival date & time 06/26/13  1050 History   First MD Initiated Contact with Patient 06/26/13 1119     Chief Complaint  Patient presents with  . Weakness  . Numbness  . Headache   (Consider location/radiation/quality/duration/timing/severity/associated sxs/prior Treatment) Patient is a 72 y.o. male presenting with weakness and headaches. The history is provided by the patient and a relative. The history is limited by the condition of the patient.  Weakness Associated symptoms include headaches.  Headache  some difficulty with communication however family members able to translate. Patient also very hard of hearing. Main complaint is right sided weakness been present for 2 days may have had similar symptoms 2 weeks ago. Also complaint of left-sided headache that is mild rated about a 5/10. Triaged patient had no focal deficit. Patient also in triage complained of right-sided numbness to his body and blurry vision.  Past Medical History  Diagnosis Date  . Hypertension    History reviewed. No pertinent past surgical history. History reviewed. No pertinent family history. History  Substance Use Topics  . Smoking status: Current Every Day Smoker  . Smokeless tobacco: Not on file  . Alcohol Use: No    Review of Systems  Unable to perform ROS Neurological: Positive for weakness and headaches.   level V caveat applies the review of systems due to difficulty communicating part language part hearing problem. Family did a cyst but cannot be certain of the accuracy.  Allergies  Review of patient's allergies indicates no known allergies.  Home Medications   Current Outpatient Rx  Name  Route  Sig  Dispense  Refill  . allopurinol (ZYLOPRIM) 100 MG tablet   Oral   Take 100 mg by mouth 3 (three) times daily as needed (for pain (gout flare)).         . naproxen (NAPROSYN) 500 MG tablet   Oral   Take 500 mg by mouth 2 (two) times daily as needed (for pain).           BP 189/110  Pulse 71  Temp(Src) 98.6 F (37 C)  Resp 10  SpO2 97% Physical Exam  Nursing note and vitals reviewed. Constitutional: He appears well-developed and well-nourished. No distress.  HENT:  Head: Normocephalic and atraumatic.  Mouth/Throat: Oropharynx is clear and moist.  Eyes: Conjunctivae and EOM are normal. Pupils are equal, round, and reactive to light.  Neck: Normal range of motion.  Cardiovascular: Normal rate, regular rhythm and normal heart sounds.   Pulmonary/Chest: Effort normal and breath sounds normal. No respiratory distress.  Abdominal: Soft. Bowel sounds are normal. There is no tenderness.  Musculoskeletal: Normal range of motion. He exhibits no edema.  Neurological: He is alert. No cranial nerve deficit. He exhibits normal muscle tone. Coordination normal.  Skin: Skin is warm. No rash noted.    ED Course  Procedures (including critical care time) Labs Review Labs Reviewed  GLUCOSE, CAPILLARY - Abnormal; Notable for the following:    Glucose-Capillary 134 (*)    All other components within normal limits  COMPREHENSIVE METABOLIC PANEL - Abnormal; Notable for the following:    Glucose, Bld 100 (*)    BUN 24 (*)    Creatinine, Ser 2.01 (*)    GFR calc non Af Amer 32 (*)    GFR calc Af Amer 37 (*)    All other components within normal limits  CBC WITH DIFFERENTIAL - Abnormal; Notable for the following:    WBC 11.0 (*)  Neutro Abs 8.2 (*)    All other components within normal limits  URINALYSIS, ROUTINE W REFLEX MICROSCOPIC - Abnormal; Notable for the following:    Hgb urine dipstick SMALL (*)    Protein, ur >300 (*)    All other components within normal limits  PRO B NATRIURETIC PEPTIDE - Abnormal; Notable for the following:    Pro B Natriuretic peptide (BNP) 175.8 (*)    All other components within normal limits  URINE MICROSCOPIC-ADD ON - Abnormal; Notable for the following:    Casts HYALINE CASTS (*)    All other components within normal  limits   Results for orders placed during the hospital encounter of 06/26/13  GLUCOSE, CAPILLARY      Result Value Range   Glucose-Capillary 134 (*) 70 - 99 mg/dL  COMPREHENSIVE METABOLIC PANEL      Result Value Range   Sodium 139  135 - 145 mEq/L   Potassium 3.7  3.5 - 5.1 mEq/L   Chloride 102  96 - 112 mEq/L   CO2 25  19 - 32 mEq/L   Glucose, Bld 100 (*) 70 - 99 mg/dL   BUN 24 (*) 6 - 23 mg/dL   Creatinine, Ser 4.09 (*) 0.50 - 1.35 mg/dL   Calcium 9.1  8.4 - 81.1 mg/dL   Total Protein 7.7  6.0 - 8.3 g/dL   Albumin 3.5  3.5 - 5.2 g/dL   AST 15  0 - 37 U/L   ALT 11  0 - 53 U/L   Alkaline Phosphatase 112  39 - 117 U/L   Total Bilirubin 1.0  0.3 - 1.2 mg/dL   GFR calc non Af Amer 32 (*) >90 mL/min   GFR calc Af Amer 37 (*) >90 mL/min  CBC WITH DIFFERENTIAL      Result Value Range   WBC 11.0 (*) 4.0 - 10.5 K/uL   RBC 5.51  4.22 - 5.81 MIL/uL   Hemoglobin 16.7  13.0 - 17.0 g/dL   HCT 91.4  78.2 - 95.6 %   MCV 86.8  78.0 - 100.0 fL   MCH 30.3  26.0 - 34.0 pg   MCHC 34.9  30.0 - 36.0 g/dL   RDW 21.3  08.6 - 57.8 %   Platelets 337  150 - 400 K/uL   Neutrophils Relative % 75  43 - 77 %   Neutro Abs 8.2 (*) 1.7 - 7.7 K/uL   Lymphocytes Relative 19  12 - 46 %   Lymphs Abs 2.1  0.7 - 4.0 K/uL   Monocytes Relative 5  3 - 12 %   Monocytes Absolute 0.5  0.1 - 1.0 K/uL   Eosinophils Relative 2  0 - 5 %   Eosinophils Absolute 0.2  0.0 - 0.7 K/uL   Basophils Relative 0  0 - 1 %   Basophils Absolute 0.0  0.0 - 0.1 K/uL  URINALYSIS, ROUTINE W REFLEX MICROSCOPIC      Result Value Range   Color, Urine YELLOW  YELLOW   APPearance CLEAR  CLEAR   Specific Gravity, Urine 1.024  1.005 - 1.030   pH 6.0  5.0 - 8.0   Glucose, UA NEGATIVE  NEGATIVE mg/dL   Hgb urine dipstick SMALL (*) NEGATIVE   Bilirubin Urine NEGATIVE  NEGATIVE   Ketones, ur NEGATIVE  NEGATIVE mg/dL   Protein, ur >469 (*) NEGATIVE mg/dL   Urobilinogen, UA 0.2  0.0 - 1.0 mg/dL   Nitrite NEGATIVE  NEGATIVE   Leukocytes,  UA NEGATIVE  NEGATIVE  PRO B NATRIURETIC PEPTIDE      Result Value Range   Pro B Natriuretic peptide (BNP) 175.8 (*) 0 - 125 pg/mL  URINE MICROSCOPIC-ADD ON      Result Value Range   WBC, UA 0-2  <3 WBC/hpf   Bacteria, UA RARE  RARE   Casts HYALINE CASTS (*) NEGATIVE    Imaging Review Dg Chest 2 View  06/26/2013   CLINICAL DATA:  72 year old male with headache dizziness weakness. Initial encounter.  EXAM: CHEST  2 VIEW  COMPARISON:  None.  FINDINGS: Mildly low lung volumes. Cardiac size at the upper limits of normal. Other mediastinal contours are within normal limits. Visualized tracheal air column is within normal limits. No pneumothorax, pulmonary edema, pleural effusion or confluent pulmonary opacity. Small benign appearing chondroid matrix type lesion in the proximal right humerus meta diaphysis. No acute osseous abnormality identified.  IMPRESSION: 1. No acute cardiopulmonary abnormality.  2. 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   Electronically Signed   By: Augusto Gamble M.D.   On: 06/26/2013 13:29   Ct Head (brain) Wo Contrast  06/26/2013   CLINICAL DATA:  72 year old male with weakness numbness on the right side headache and blurred vision. Initial encounter.  EXAM: CT HEAD WITHOUT CONTRAST  TECHNIQUE: Contiguous axial images were obtained from the base of the skull through the vertex without intravenous contrast.  COMPARISON:  None.  FINDINGS: Visualized paranasal sinuses and mastoids are clear. No acute osseous abnormality identified. Visualized orbits and scalp soft tissues are within normal limits.  Cortical and white matter hypodensity in the left PCA territory primarily affecting the left occipital lobe. There is mild associated mass effect, such as on the left occipital horn. No associated hemorrhage is identified.  Elsewhere there are chronic appearing lacunar infarcts in the bilateral deep gray matter nuclei, and nearby white matter capsules.  No ventriculomegaly. No  midline shift. Basilar cisterns are patent. No acute intracranial hemorrhage identified. No suspicious intracranial vascular hyperdensity.  IMPRESSION: 1. Subacute appearing left PCA infarct. Minimal to mild mass effect. No associated hemorrhage.  2.  Underlying advanced chronic small vessel ischemia.   Electronically Signed   By: Augusto Gamble M.D.   On: 06/26/2013 11:49    EKG Interpretation   None      Date: 06/26/2013  Rate: 91  Rhythm: normal sinus rhythm  QRS Axis: normal  Intervals: normal  ST/T Wave abnormalities: nonspecific ST/T changes  Conduction Disutrbances:none  Narrative Interpretation:   Old EKG Reviewed: none available  He did name switch EKG did not enter properly into Muse.  MDM   1. CVA (cerebral infarction)    CT shows signs of a subacute infarct. We'll discuss with neurology will most likely require admission patient does not have a primary care Dr. Patient here has no focal neuro deficits. Headache complaint could be explained by the head CT findings MRI will probably be required during admission.  Discuss with neurology service. They agree needs admission. Patient has no primary care Dr. will contact the undersigned service for admission. Neurology will follow.  Shelda Jakes, MD 06/26/13 1416  Shelda Jakes, MD 06/26/13 1430

## 2013-06-26 NOTE — Progress Notes (Signed)
Patient has blood pressure 200/94(manual) and heart rate 75. Also has headache 6/10. MD notified. Received prn order for tylenol and was told to recheck BP in 2 hours and notify if Systolic > 220 and Diastolic >110. Will continue ot monitor

## 2013-06-26 NOTE — H&P (Signed)
Family Medicine Teaching [facility] Admission History and Physical Service Pager: [phone]  Patient name: Tim [name] Medical record number: [id number] Date of birth: April 29, 1941 Age: 72 y.o. Gender: male  Primary Care Provider: No primary provider on file. Consultants: Neuro Code Status: Full  Chief Complaint: right sided weakness/numbness  Assessment and Plan: Desmen Colasanti is a 72 y.o. male presenting with left sided headache and right sided weakness and numbness found to have a subacute left PCA infarct. PMH is significant for previous ischemic stroke in 2012 with minimal f/up, untreated HTN, HLD.  # Neuro:Subacute left PCA infarct/headache- pt with prev ischemic strokes (post limb of the internal capsule, left external capsule and chronic lacunar infacts), not on medications and evidence of untreated htn/hld per chart review. No evidence of residual deficits per exam this evening apart from possible right sided numbness. However did not formally assess gait. Pt also with left sided hearing loss (but per family member this is chronic issue). Risk factors including smoking hx, HTN, HLD, no fmhx per family member. Elevated WBC 11.0 -neuro consulted in the ED, appreciate their help in managing this patient -ASA 81 -risk stratification labs- TSH, A1C, lipid panel (will likely need statin anyway given presentation ASCVD and hx of untreated HLD) -MRI/MRA brain -PT/OT eval and treat -2D echo and carotids -tele, neuro checks   #Cards: Abnormal EKG/HTN- Not complaining of chest pain but ST depression noted on EKG without lead pattern. Patient in not in acute setting of stroke so does not need permissive HTN at this time. Suspect pt has long standing untreated HTN which is clearly a risk factor in this case -amlodipine 5 mg daily -vs monitoring, would treat systolic >180 or diastolic >105 -repeat EKG, trop X3  # Renal: AKI on CKD vs progression of CKD- 2 years ago cr 1.6, suspect has CKD, currently  2.01, also with 300 protein in urine. -gentle rehydration NS 75 mL/hr -trend with BMETs -avoid nephrotoxic agents  #MSK:Gout- on allopurinol at home -will hold in house given cr  FEN/GI: heart healthy, passed bedside swallow study Prophylaxis: HSQ  Disposition: admit to tele under Dr. Gwendolyn Grant, discharge pending completion of stroke work-up  History of Present Illness: Tim [name] is a 72 y.o. male presenting with left sided headache and right sided body numbness/weakness approximately 2 days ago. Family member provided most of the history given language and hearing barrier and translated for the patient. Per family member noted patient was having trouble walking with this right sided weakness and was c/o some blurry vision, L side worse than right. Also had concomitant dizziness at that time. Headache described as sharp stabbing pain in head mostly located on left side of head. No relief by OTC medications. Denies facial droop, dysarthria, CP, palpitations, sob. Family brought pt in because symptoms were not improving, however denies worsening of presentation.   In the ED pt was noted to have a subacute left PCA infarct. Neuro was consulted and started ASA 81. U/A with >300 protein. Cr 2.01. PBNP 175. WBC 11.0. BP also noted to be 189/110.   With regards to risk factors denies DM, HLD. Does have hx of HTN but has not been on medication for a while. No fmhx of stroke or MI as far as son is concerned. Is a 44pack yr smoker. Was allegedly supposed to go home on a statin after last admission for ischemic infarct but only medications were naproxen and allopurinol. Note admission to our service in September 2012 for similar symptoms, with  left sided headache and right sided weakness and numbness. MRI revealed acute infarct of post limb of the internal capsule, chronic infarct of left external capsule and chronic lacunar infarct. Patient was found to have elevated cholesterol at that time and was to be started  on simvastatin 40 mg. Patient also was to be started on lisinopril/HCTZ. Notes he has a doctor at [facility] urgent care, though unsure of last visit.  Review Of Systems: Per HPI with the following additions: hard of hearing (chronic) Otherwise 12 point review of systems was performed and was unremarkable.  Patient Active Problem List   Diagnosis Date Noted  . CVA (cerebral infarction) 06/26/2013   Past Medical History: Past Medical History  Diagnosis Date  . Hypertension    Past Surgical History: History reviewed. No pertinent past surgical history. Social History: History  Substance Use Topics  . Smoking status: Current Every Day Smoker  . Smokeless tobacco: Not on file  . Alcohol Use: No   Additional social history: approx 6 cig a day for approx 44 years Please also refer to relevant sections of EMR.  Family History: History reviewed. No pertinent family history. Allergies and Medications: No Known Allergies No current facility-administered medications on file prior to encounter.   No current outpatient prescriptions on file prior to encounter.  Medications at home include naproxen and allopurinol.   Objective: BP 189/110  Pulse 71  Temp(Src) 98.6 F (37 C)  Resp 10  SpO2 97% Exam: General: awake alert, non english speaking, NAD HEENT: NCAT, MMM, EOMI, PERRL  Cardiovascular: RRR, no murmurs rubs or gallop Respiratory: CTAB no wheezing or increased WOB Abdomen: soft, nd, tender in the right lower quadrant to deep palpation Extremities: no erythema edema Skin: no bruising or lesions or rash Neuro: alert, CNii-xii intact with exception of chronic hearing loss in left ear>r; nml FTN HTS, reflex 2/4 in patella, achilles, and brachoradialis, no clonus, neg babinski, normal tone, strength 5/5 in bilateral deltoids, biceps, triceps, grip, hip flexors, plantar and dorsiflexion, sensation intact with exception of some heaviness felt in right upper extremity on light  touch  Labs and Imaging: CBC BMET   Recent Labs Lab 06/26/13 1205  WBC 11.0*  HGB 16.7  HCT 47.8  PLT 337    Recent Labs Lab 06/26/13 1205  NA 139  K 3.7  CL 102  CO2 25  BUN 24*  CREATININE 2.01*  GLUCOSE 100*  CALCIUM 9.1     CXR 06/26/13 IMPRESSION: 1. No acute cardiopulmonary abnormality. 2. 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 Subacute appearing left PCA infarct. Minimal to mild mass effect. No associated hemorrhage. 2. Underlying advanced chronic small vessel ischemia.   Anselm Lis, MD 06/26/2013, 4:42 PM PGY-1, [facility] FPTS Intern pager: [phone], text pages welcome  Upper Level Addendum:  I have seen and evaluated this patient along with Dr. Michail Jewels and reviewed the above note, making necessary revisions in red.   Marikay Alar, MD Family Medicine PGY-2

## 2013-06-26 NOTE — ED Notes (Signed)
Per pt family pt has had HA, weakness, blurry vision, and numbness to the right side of his body for 2 days. Grip strength equal at triage. No facial droop noted.

## 2013-06-27 ENCOUNTER — Other Ambulatory Visit: Payer: Self-pay

## 2013-06-27 DIAGNOSIS — E785 Hyperlipidemia, unspecified: Secondary | ICD-10-CM

## 2013-06-27 DIAGNOSIS — M6281 Muscle weakness (generalized): Secondary | ICD-10-CM

## 2013-06-27 DIAGNOSIS — I359 Nonrheumatic aortic valve disorder, unspecified: Secondary | ICD-10-CM

## 2013-06-27 DIAGNOSIS — I10 Essential (primary) hypertension: Secondary | ICD-10-CM

## 2013-06-27 LAB — CBC
HCT: 40.8 % (ref 39.0–52.0)
MCH: 30.7 pg (ref 26.0–34.0)
MCV: 88.1 fL (ref 78.0–100.0)
Platelets: 311 10*3/uL (ref 150–400)
RBC: 4.63 MIL/uL (ref 4.22–5.81)
RDW: 13.5 % (ref 11.5–15.5)

## 2013-06-27 LAB — BASIC METABOLIC PANEL
BUN: 26 mg/dL — ABNORMAL HIGH (ref 6–23)
CO2: 22 mEq/L (ref 19–32)
Calcium: 8.4 mg/dL (ref 8.4–10.5)
Creatinine, Ser: 1.78 mg/dL — ABNORMAL HIGH (ref 0.50–1.35)
GFR calc Af Amer: 43 mL/min — ABNORMAL LOW (ref >90)

## 2013-06-27 LAB — TROPONIN I: Troponin I: <0.3 ng/mL (ref <0.30)

## 2013-06-27 LAB — LIPID PANEL
Cholesterol: 216 mg/dL — ABNORMAL HIGH (ref 0–200)
LDL Cholesterol: 148 mg/dL — ABNORMAL HIGH (ref 0–99)
Triglycerides: 143 mg/dL (ref <150)
VLDL: 29 mg/dL (ref 0–40)

## 2013-06-27 MED ORDER — ASPIRIN 81 MG PO TBEC: 81.0000 mg | DELAYED_RELEASE_TABLET | Freq: Every day | ORAL | Status: DC

## 2013-06-27 MED ORDER — ATORVASTATIN CALCIUM 40 MG PO TABS: 40.0000 mg | ORAL_TABLET | Freq: Every day | ORAL | Status: DC

## 2013-06-27 MED ORDER — AMLODIPINE BESYLATE 5 MG PO TABS: 5.0000 mg | ORAL_TABLET | Freq: Every day | ORAL | Status: DC

## 2013-06-27 MED ORDER — ASPIRIN 325 MG PO TABS: 325.0000 mg | ORAL_TABLET | Freq: Every day | ORAL | Status: DC

## 2013-06-27 MED ORDER — ASPIRIN 325 MG PO TABS
325.0000 mg | ORAL_TABLET | Freq: Every day | ORAL | Status: DC
  Administered 2013-06-28 – 2013-07-01 (×4): 325 mg via ORAL
  Filled 2013-06-27 (×4): qty 1

## 2013-06-27 MED ORDER — ATORVASTATIN CALCIUM 40 MG PO TABS
40.0000 mg | ORAL_TABLET | Freq: Every day | ORAL | Status: DC
  Administered 2013-06-27 – 2013-06-30 (×4): 40 mg via ORAL
  Filled 2013-06-27 (×6): qty 1

## 2013-06-27 MED ORDER — ASPIRIN EC 81 MG PO TBEC
243.0000 mg | DELAYED_RELEASE_TABLET | Freq: Once | ORAL | Status: AC
  Administered 2013-06-27: 243 mg via ORAL
  Filled 2013-06-27: qty 3

## 2013-06-27 NOTE — Progress Notes (Signed)
Family Medicine Teaching Service Daily Progress Note Intern Pager: [phone]  Patient name: Tim [name] Medical record number: [id number] Date of birth: Jan 25, 1941 Age: 72 y.o. Gender: male  Primary Care Provider: No primary provider on file. Consultants: Neurology Code Status: Full  Pt Overview and Major Events to Date:  12/5 - CT / MRI confirmed subacute non-hemorrhagic L-PCA infarct 12/6 - pending PT/OT, ECHO, Carotid Dopplers. Repeat EKG (normal)  Assessment and Plan: Exodus Garrabrant is a 72 y.o. [place] male presenting with left sided headache and right sided weakness and numbness found to have a subacute left PCA infarct. PMH is significant for previous ischemic stroke in 2012 with minimal f/up, untreated HTN, HLD.  # Neuro:Subacute left PCA infarct/headache H/o prev ischemic strokes (post limb of the internal capsule, left external capsule and chronic lacunar infacts), not on medications and evidence of untreated HTN/HLD per chart review. No evidence of residual deficits per exam on admission apart from possible right sided numbness. However did not formally assess gait. Pt also with left sided hearing loss (but per family member this is chronic issue). Risk factors including smoking hx, HTN, HLD, no fmhx per family member. Elevated WBC 11.0 Today he is stable, without any residual focal neuro findings (resolved R-hemiparesis and weakness). Noted to have transient episodes of confusion, since resolved. - MRI/MRA brain (Acute/subacute mod size nonhemorrhagic infarct L-temporal, L-occipital, L-thalamus) - continue telemetry - frequent neuro checks - inc to ASA 325 daily - risk stratification labs- HgbA1c (5.8), Lipid panel (TC 216, TG 143, HDL 39, LDL 148), TSH (pending) - start Atorvastatin 40mg  daily - PT/OT eval and treat today - 2D echo and carotids   # Uncontrolled HTN Denies chest pain but ST depression noted on EKG without lead pattern. CVA considered subacute, therefore outside  range of permissive HTN. Suspect pt has long standing untreated HTN which is clearly a risk factor in this case - f/u BP, with goal < 180 / 105 - repeat EKG - troponins (negative x3) - Amlodipine 5mg  daily, continue on discharge  # AKI on CKD vs progression of CKD - 2 years ago Cr 1.6, suspect has CKD, currently 2.01, also with 300 protein in urine.  - gentle rehydration NS 75 mL/hr  - trend with BMETs  - avoid nephrotoxic agents  # h/o Gout Treated with allopurinol at home  - will Hold due to elevated Cr  FEN/GI: heart healthy, passed bedside swallow study  Prophylaxis: SQ Heparin  Disposition: Admitted to FPTS, observation status, plan to discharge home pending completion of stroke work-up and continued improved clinical course, expect possible discharge to home today after work-up complete.  Subjective: Overnight significant for sustained hypertension peak at 200/84 trended down to 153/93 without any further interventions following Amlodipine 5mg  at 1951, but held further anti-HTN agents due to significant decreasing trend. Noted to have headache that improved with Tylenol, otherwise no significant changes in neuro status or complaints. Spoke with son who was able to translate during interview. Today with improved headache. Resolved Right sided weakness and numbness. Note occasional episodes of confusion overnight, appears to be improved this morning. Waiting for multiple tests today also pending PT/OT. Son agrees to schedule his father a doctor's appointment early next week.  Objective: Temp:  [97.7 F (36.5 C)-98.6 F (37 C)] 98.2 F (36.8 C) (12/06 0200) Pulse Rate:  [69-95] 86 (12/06 0200) Resp:  [10-26] 20 (12/06 0200) BP: (124-200)/(84-130) 153/93 mmHg (12/06 0200) SpO2:  [94 %-98 %] 96 % (12/06 0200) Weight:  [  130 lb (58.968 kg)] 130 lb (58.968 kg) (12/05 1734) Physical Exam: General: sleeping comfortably, easily woken, interviewed with assistance of Son as interpreter,  NAD Cardiovascular: RRR, no murmurs Respiratory: CTAB, normal work of breathing Abdomen: soft, NTND, +active BS Extremities: non-tender, no edema, +2 peripheral pulses Neuro: awake, alert, oriented w/o confusion this AM, CN-II-XII intact, exam grossly non-focal, muscle strength 5/5 equal b/l, distal sensation to light touch intact w/ resolved R-numbness.  Laboratory:  Recent Labs Lab 06/26/13 1205 06/26/13 1940  WBC 11.0* 10.6*  HGB 16.7 15.7  HCT 47.8 45.5  PLT 337 326    Recent Labs Lab 06/26/13 1205 06/26/13 1940  NA 139  --   K 3.7  --   CL 102  --   CO2 25  --   BUN 24*  --   CREATININE 2.01* 1.99*  CALCIUM 9.1  --   PROT 7.7  --   BILITOT 1.0  --   ALKPHOS 112  --   ALT 11  --   AST 15  --   GLUCOSE 100*  --    Troponin (negative x 3)  Imaging/Diagnostic Tests:  12/5 CXR IMPRESSION:  1. No acute cardiopulmonary abnormality.  2. 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 Subacute appearing left PCA infarct. Minimal to mild mass effect.  No associated hemorrhage.  2. Underlying advanced chronic small vessel ischemia.  12/5 MRI Brain / MRA Head IMPRESSION:  Some of the sequences are motion degraded.  Acute/ subacute moderate size nonhemorrhagic infarct medial left  temporal lobe, left occipital lobe and left thalamus.  Remote basal ganglia infarcts. Remote left caudate head infarct.  Remote right coronal radiata infarct. Remote infarct left genu of  the corpus callosum. Remote tiny left thalamic infarct. Small vessel  disease type changes.  Global atrophy without hydrocephalus.  No intracranial hemorrhage.  Non visualization left posterior cerebral artery consistent with  patient's acute infarct.  12/6 EKG NSR, HR 91, non-specific ST-depression  12/6 Repeat EKG NSR, HR 68, no acute findings, resolved ST-depression  Saralyn Pilar, DO 06/27/2013, 4:35 AM PGY-1, [facility] Family  Medicine FPTS Intern pager: [phone], text pages welcome

## 2013-06-27 NOTE — Progress Notes (Signed)
Pt c/o blurred vision R side (as translated by son), unable to recognize objects in peripheral field.  See doc flow sheets.

## 2013-06-27 NOTE — Progress Notes (Signed)
FMTS Attending Daily Note:  Renold Don MD  [phone] pager  Family Practice pager:  [phone] I have seen and examined this patient and have reviewed their chart. I have discussed this patient with the resident. I agree with the resident's findings, assessment and care plan.  Additionally:  Patient admitted for new CVA, history of multiple in past, various vascular distributions.  Awaiting Echo. PT recommends inpatient rehab, await their input.  Likely here with [place] through the weekend.   No complaints, still some residual right sided weakness and worse ataxia than baseline.     Tobey Grim, MD 06/27/2013 11:32 AM

## 2013-06-27 NOTE — Progress Notes (Signed)
  Echocardiogram 2D Echocardiogram has been performed.  Tim [name] 06/27/2013, 4:07 PM

## 2013-06-27 NOTE — Evaluation (Signed)
Physical Therapy Evaluation Patient Details Name: Tim [name] MRN: [id number] DOB: [birth year] Today's Date: 06/27/2013 Time: 4098-1191 PT Time Calculation (min): 26 min  PT Assessment / Plan / Recommendation History of Present Illness  Tim [name] is an 72 y.o. male with a past medical history significant for HTN, heavy smoking, brought to [facility] ED by family due to new onset HA and right sided weakness.    Clinical Impression  Patient demonstrates deficits in functional mobility as indicated below. PT will benefit from continued skilled PT services to address deficits and maximize function. PTA patient was independent with all aspects of mobility, at this time feel patient will need intense comprehensive therapy services to address deficits. Recommend CIR upon discharge from acute care. Additionally, recommend further cognition screening  Due to decreased attention and visual assessment as patient reports significant deficits in vision throughout evaluation. Will continue to see as indicated and progress as tolerated.Marland Kitchen    PT Assessment  Patient needs continued PT services    Follow Up Recommendations  CIR    Does the patient have the potential to tolerate intense rehabilitation      Barriers to Discharge        Equipment Recommendations  Other (comment) (TBD)    Recommendations for Other Services Rehab consult   Frequency Min 4X/week    Precautions / Restrictions Precautions Precautions: Fall Restrictions Weight Bearing Restrictions: No   Pertinent Vitals/Pain Headache (no value given), dizziness reported with mobilty      Mobility  Bed Mobility Bed Mobility: Supine to Sit;Sitting - Scoot to Edge of Bed Supine to Sit: 4: Min assist Sitting - Scoot to [place] of Bed: 4: Min assist Transfers Transfers: Sit to Stand;Stand to Sit Sit to Stand: 3: Mod assist Stand to Sit: 4: Min assist Details for Transfer Assistance: Assist for stability, VC and tactile cues for positioning and hand  placement Ambulation/Gait Ambulation/Gait Assistance: 3: Mod assist;2: Max assist Ambulation Distance (Feet): 90 Feet Assistive device: 1 person hand held assist Ambulation/Gait Assistance Details: Very un coordinated and ataxic, multiple LOB requiring increased assist to prevent fall, serpent like movement with RLE (non-voluntary)  Gait Pattern: Step-through pattern;Decreased stride length;Right steppage;Scissoring;Ataxic;Narrow base of support Gait velocity: decreased General Gait Details: Very unsteady gait Modified Rankin (Stroke Patients Only) Pre-Morbid Rankin Score: No symptoms Modified Rankin: Moderately severe disability    Exercises     PT Diagnosis: Difficulty walking;Abnormality of gait;Acute pain  PT Problem List: Decreased strength;Decreased activity tolerance;Decreased balance;Decreased mobility;Decreased coordination;Decreased cognition;Decreased knowledge of use of DME;Pain PT Treatment Interventions: DME instruction;Gait training;Stair training;Functional mobility training;Therapeutic activities;Therapeutic exercise;Balance training;Patient/family education     PT Goals(Current goals can be found in the care plan section) Acute Rehab PT Goals Patient Stated Goal: none stated PT Goal Formulation: With patient/family Time For Goal Achievement: 07/11/13 Potential to Achieve Goals: Good  Visit Information  Last PT Received On: 06/27/13 Assistance Needed: +1 History of Present Illness: Tim [name] is an 72 y.o. male with a past medical history significant for HTN, heavy smoking, brought to [facility] ED by family due to new onset HA and right sided weakness.         Prior Functioning  Home Living Family/patient expects to be discharged to:: Private residence Living Arrangements: Children Available Help at Discharge: Family Type of Home: House Home Access: Stairs to enter Secretary/administrator of Steps: 3 Entrance Stairs-Rails: None Home Layout: One level Home Equipment:  None Prior Function Level of Independence: Independent Communication Communication: Prefers language other than English;Other (comment) (  appears to have decreased attention, max multi modal cues) Dominant Hand: Right    Cognition  Cognition Arousal/Alertness: Awake/alert Behavior During Therapy: WFL for tasks assessed/performed Overall Cognitive Status: Difficult to assess Difficult to assess due to: Impaired communication;Non-English speaking    Extremity/Trunk Assessment Upper Extremity Assessment Upper Extremity Assessment: Defer to OT evaluation Lower Extremity Assessment Lower Extremity Assessment: RLE deficits/detail RLE Deficits / Details: weakness in RLE 4-/5; poor coordination, ataxic gait with serpent RLE RLE Coordination: decreased fine motor;decreased gross motor   Balance Balance Balance Assessed: Yes Static Sitting Balance Static Sitting - Balance Support: Feet supported Static Sitting - Level of Assistance: 5: Stand by assistance Static Standing Balance Static Standing - Balance Support: During functional activity Static Standing - Level of Assistance: 3: Mod assist  End of Session PT - End of Session Equipment Utilized During Treatment: Gait belt Activity Tolerance: Patient tolerated treatment well Patient left: in chair;with call bell/phone within reach;with family/visitor present Nurse Communication: Mobility status  GP     Fabio Asa 06/27/2013, 2:09 PM Charlotte Crumb, PT DPT  [phone]

## 2013-06-27 NOTE — Discharge Summary (Signed)
Family Medicine Teaching [facility] Discharge Summary  Patient name: Tim [name] Medical record number: [id number] Date of birth: 02/22/1941 Age: 72 y.o. Gender: male Date of Admission: 06/26/2013  Date of Discharge: 07/01/13 Admitting Physician: Tobey Grim, MD  Primary Care Provider: No primary provider on file. 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 [place] male presenting with left sided headache and right sided weakness and numbness found to have a subacute left PCA infarct. PMH is significant for multiple prior ischemic strokes (last documented 2012 with minimal f/up), untreated HTN, HLD, tobacco abuse.  # Subacute Left-PCA Infarct, with h/o multiple prior chronic ischemic strokes Presented with new-onset R-sided weakness and numbness, L-HA, of about 2 day duration prior to arriving in ED (presented outside of TPA window). Stroke team involved and initial work-up with Head CT showed subacute L-PCA infarct, prompting further imaging with MRI (acute/subacute mod size non-hemorrhagic infarct L-temporal, L-occipital, L-thalamus infarcts, with noted multiple significant remote infarcts). Rest of CVA work-up included ECHO (normal), Carotids (normal), EKG initially with ST depressions resolved on repeat. Due to prior h/o poor follow-up, patient presented without any home meds related to prior stroke (no ASA, no anti-HTN), started ASA 325 daily,  Amlodipine to 10mg , and Atorvastatin 40mg  daily. Risk stratification labs (HgbA1c 5.8, lipid panel, TSH drawn). PT/OT continued to evaluate and recommended CIR for continued rehab, as pt has residual R-sided weakness and hemiparesis (overall improved). Stroke Team recommended TEE (negative) for investigation of potential PFO and embolic source to account for multiple strokes, additionally external 30 day event monitor placed via Cardiology to eval for suspected Atrial Fib as etiology of cryptogenic stroke. If no arrhythmia in 30 days, plan for prolonged loop monitor. Upon completion of testing, determined that no beds available for CIR and patient would most likely benefit from discharge home with PT/OT (and CSW for potential future placement), as we believe physical residual weaknesses were borderline for CIR and suspect that primary deficit is cognitive with likely vascular dementia.  # HTN - Improved Significantly elevated HTN on admission >180/110, with history of prior untreated HTN. Initially no aggressive BP management started to allow permissive HTN in setting of acute/subacute CVA and due to chronic HTN. Gradually initiated therapy with Amlodipine 5mg  and titrated up to 10mg  daily. Noted significant improvement and currently stable at 130-140s/80s. Suspect that chronic untreated HTN is primary etiology for multiple ischemic strokes.  # Acute Encephalopathy, secondary to suspected vascular dementia in setting of multiple CVA - Improved Reported confusion by patient's son. Poor orientation, mostly only to person. Minimal orientation to date and place; however, unclear if these are known by pt at baseline, but talking with his sons they believe he is intermittently confused. Suspect due to subacute/chronic vascular dementia with significant h/o multiple CVAs. Additionally, hospital setting can contribute to acutely worsening encephalopathy.  # AKI, setting of CKD (Stage III) - Resolved Review of chart  showed 2 years ago Cr 1.6, suspect has CKD (III). Cr on admission 2.01. Monitored and held nephrotoxic agents. Monitored Cr with improvement  noted down to 1.76.   # h/o Gout  Treated with allopurinol at home. Did not continue home allopurinol d/t elevated Cr. No acute problems with gout and no recent flares.  Issues for Follow Up:  1. Continued rehab (homem PT/OT) - Goals to improve strength and work on functional goals. Has CSW arranged as he may need future placement to CIR or care facility 2. Mental Status - Suspect some level of chronic vascular dementia. 3. HTN management - h/o chronic HTN without treatment. Monitor BPs and medically manage to help reduce future risk of CVA 4. Daily ASA 325mg  5. Follow-up 30 Day Event Monitor - [facility] follow-up with Dr. Alben Spittle in 6 weeks. Determine if Atrial Fib, otherwise may need implanted loop monitor. Suspected cryptogenic strokes due to AFib.  Significant Procedures: None  Significant Labs and Imaging:   Recent Labs Lab 06/26/13 1205 06/26/13 1940 06/27/13 0946  WBC 11.0* 10.6* 8.5  HGB 16.7 15.7 14.2  HCT 47.8 45.5 40.8  PLT 337 326 311    Recent Labs Lab 06/26/13 1205 06/26/13 1940 06/27/13 0946 06/29/13 0536  NA 139  --  141 138  K 3.7  --  3.6 3.5  CL 102  --  108 105  CO2 25  --  22 22  GLUCOSE 100*  --  88 91  BUN 24*  --  26* 29*  CREATININE 2.01* 1.99* 1.78* 1.76*  CALCIUM 9.1  --  8.4 8.7  ALKPHOS 112  --   --   --   AST 15  --   --   --   ALT 11  --   --   --   ALBUMIN 3.5  --   --   --     12/5 CXR  IMPRESSION:  1. No acute cardiopulmonary abnormality.  2. 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 Subacute appearing left PCA infarct. Minimal to mild mass effect.  No associated hemorrhage.  2. Underlying advanced chronic small vessel ischemia.  12/5 MRI Brain / MRA Head  IMPRESSION:  Some of the sequences are motion degraded.  Acute/ subacute  moderate size nonhemorrhagic infarct medial left  temporal lobe, left occipital lobe and left thalamus.  Remote basal ganglia infarcts. Remote left caudate head infarct.  Remote right coronal radiata infarct. Remote infarct left genu of  the corpus callosum. Remote tiny left thalamic infarct. Small vessel  disease type changes.  Global atrophy without hydrocephalus.  No intracranial hemorrhage.  Non visualization left posterior cerebral artery consistent with  patient's acute infarct.  12/6 EKG  NSR, HR 91, non-specific ST-depression  12/6 Repeat EKG  NSR, HR 68, no acute findings, resolved ST-depression  12/6 2D ECHO  Left ventricle: The cavity size was normal. Wall thickness was normal. Systolic function was normal. The estimated ejection fraction was in the range of 55% to 60%. Wall motion was normal; there were no regional wall motion abnormalities. Doppler parameters are consistent with abnormal left ventricular relaxation (grade 1 diastolic dysfunction). - Aortic valve: Trileaflet; mildly thickened leaflets. Mild regurgitation. - Atrial septum: No defect or patent foramen ovale was Identified.  12/6 Carotid Dopplers  Preliminary findings 1-39% ICA stenosis, Negative  12/9 Transesophageal ECHO Normal LV size and systolic function, EF 55-60%. Normal RV size and systolic function. No significant valvular abnormalities. No LA appendage thrombus. No PFO or ASD. There was grade IV plaque in the descending thoracic aorta but only mild plaque noted in  the aortic arch.  No source of embolus.   Results/Tests Pending at Time of Discharge: None  Discharge Medications:    Medication List         allopurinol 100 MG tablet  Commonly known as:  ZYLOPRIM  Take 100 mg by mouth 3 (three) times daily as needed (for pain (gout flare)).     amLODipine 10 MG tablet  Commonly known as:  NORVASC  Take 1 tablet (10 mg total) by mouth daily.     aspirin 325 MG tablet  Take 1 tablet  (325 mg total) by mouth daily.     atorvastatin 40 MG tablet  Commonly known as:  LIPITOR  Take 1 tablet (40 mg total) by mouth daily.     naproxen 500 MG tablet  Commonly known as:  NAPROSYN  Take 500 mg by mouth 2 (two) times daily as needed (for pain).        Discharge Instructions: Please refer to Patient Instructions section of EMR for full details.  Patient was counseled important signs and symptoms that should prompt return to medical care, changes in medications, dietary instructions, activity restrictions, and follow up appointments.   Follow-Up Appointments: Follow-up Information   Schedule an appointment as soon as possible for a visit with Primary Doctor. (Please call your doctor's office to make an appointment to be seen early next week.)       Follow up with Tereso Newcomer, PA-C In 6 weeks.   Specialty:  Physician Assistant   Contact information:   [address] [place] [address] [phone]       Follow up with Gates Rigg, MD In 2 months.   Specialties:  Neurology, Radiology   Contact information:   [address] [place] [address] [phone]       Schedule an appointment as soon as possible for a visit with [facility]    .   Contact information:   [address] [place] [address] [phone]      Saralyn Pilar, DO 07/02/2013, 5:30 AM PGY-1, [facility]

## 2013-06-27 NOTE — Progress Notes (Signed)
*  PRELIMINARY RESULTS* Vascular Ultrasound Carotid Duplex (Doppler) has been completed.  Preliminary findings: Bilateral:  1-39% ICA stenosis.  Vertebral artery flow is antegrade.      Farrel Demark, RDMS, RVT  06/27/2013, 11:42 AM

## 2013-06-27 NOTE — H&P (Signed)
FMTS Attending Admission Note: Renold Don MD Personal pager:  [phone] FPTS Service Pager:  [phone]  I  have seen and examined this patient, reviewed their chart. I have discussed this patient with the resident. I agree with the resident's findings, assessment and care plan.   Tobey Grim, MD 06/27/2013 11:30 AM

## 2013-06-27 NOTE — Progress Notes (Signed)
Stroke Team Progress Note  HISTORY Tim [name] is an 72 y.o. male with a past medical history significant for HTN, heavy smoking, brought to [facility] ED by family due to new onset HA and right sided weakness.  Patient doesn't speak English and all information is provided by family who expressed taht Tim [name] started complaining of HA 2 days ago and then became " paralyzed in the right side" to the degree that walking has been a major struggle and he had a fall. No reported vertigo, double vision, slurred speech, language or vision impairment. 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   Patient was not a TPA candidate secondary to outside of TPA window.   SUBJECTIVE R sided strength improved, still complaint of diplopia.   OBJECTIVE Most recent Vital Signs: Filed Vitals:   06/27/13 0400 06/27/13 0600 06/27/13 0801 06/27/13 0903  BP: 159/96 154/89 149/85 165/97  Pulse: 83 84 77 74  Temp: 98.7 F (37.1 C) 97.8 F (36.6 C) 98.2 F (36.8 C) 98.2 F (36.8 C)  TempSrc: Oral Oral Oral Oral  Resp: 20 20 20 20   Height:      Weight:      SpO2: 97% 97% 96% 97%   CBG (last 3)   Recent Labs  06/26/13 1153  GLUCAP 134*    IV Fluid Intake:   . sodium chloride 75 mL/hr at 06/26/13 1347    MEDICATIONS  . amLODipine  5 mg Oral Daily  . aspirin EC  81 mg Oral Daily  . heparin subcutaneous  5,000 Units Subcutaneous Q8H   PRN:  acetaminophen, senna-docusate  Diet:  Cardiac  Activity:   Up with assistance DVT Prophylaxis:  Scds, ambulating  CLINICALLY SIGNIFICANT STUDIES Basic Metabolic Panel:  Recent Labs Lab 06/26/13 1205 06/26/13 1940  NA 139  --   K 3.7  --   CL 102  --   CO2 25  --   GLUCOSE 100*  --   BUN 24*  --   CREATININE 2.01* 1.99*  CALCIUM 9.1  --    Liver Function Tests:  Recent Labs Lab 06/26/13 1205  AST 15  ALT 11  ALKPHOS 112  BILITOT 1.0  PROT 7.7  ALBUMIN 3.5   CBC:  Recent Labs Lab 06/26/13 1205 06/26/13 1940  WBC 11.0* 10.6*   NEUTROABS 8.2*  --   HGB 16.7 15.7  HCT 47.8 45.5  MCV 86.8 88.3  PLT 337 326   Coagulation: No results found for this basename: LABPROT, INR,  in the last 168 hours Cardiac Enzymes:  Recent Labs Lab 06/26/13 1940 06/27/13 0143 06/27/13 0810  TROPONINI <0.30 <0.30 <0.30   Urinalysis:  Recent Labs Lab 06/26/13 1323  COLORURINE YELLOW  LABSPEC 1.024  PHURINE 6.0  GLUCOSEU NEGATIVE  HGBUR SMALL*  BILIRUBINUR NEGATIVE  KETONESUR NEGATIVE  PROTEINUR >300*  UROBILINOGEN 0.2  NITRITE NEGATIVE  LEUKOCYTESUR NEGATIVE   Lipid Panel    Component Value Date/Time   CHOL 216* 06/27/2013 0143   TRIG 143 06/27/2013 0143   HDL 39* 06/27/2013 0143   CHOLHDL 5.5 06/27/2013 0143   VLDL 29 06/27/2013 0143   LDLCALC 148* 06/27/2013 0143   HgbA1C  Lab Results  Component Value Date   HGBA1C 5.8* 06/26/2013    Urine Drug Screen:   No results found for this basename: labopia, cocainscrnur, labbenz, amphetmu, thcu, labbarb    Alcohol Level: No results found for this basename: ETH,  in the last 168 hours  Dg Chest 2  View  06/26/2013   CLINICAL DATA:  72 year old male with headache dizziness weakness. Initial encounter.  EXAM: CHEST  2 VIEW  COMPARISON:  None.  FINDINGS: Mildly low lung volumes. Cardiac size at the upper limits of normal. Other mediastinal contours are within normal limits. Visualized tracheal air column is within normal limits. No pneumothorax, pulmonary edema, pleural effusion or confluent pulmonary opacity. Small benign appearing chondroid matrix type lesion in the proximal right humerus meta diaphysis. No acute osseous abnormality identified.  IMPRESSION: 1. No acute cardiopulmonary abnormality.  2. 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   Electronically Signed   By: Augusto Gamble M.D.   On: 06/26/2013 13:29   Ct Head (brain) Wo Contrast  06/26/2013   CLINICAL DATA:  72 year old male with weakness numbness on the right side headache and blurred vision. Initial  encounter.  EXAM: CT HEAD WITHOUT CONTRAST  TECHNIQUE: Contiguous axial images were obtained from the base of the skull through the vertex without intravenous contrast.  COMPARISON:  None.  FINDINGS: Visualized paranasal sinuses and mastoids are clear. No acute osseous abnormality identified. Visualized orbits and scalp soft tissues are within normal limits.  Cortical and white matter hypodensity in the left PCA territory primarily affecting the left occipital lobe. There is mild associated mass effect, such as on the left occipital horn. No associated hemorrhage is identified.  Elsewhere there are chronic appearing lacunar infarcts in the bilateral deep gray matter nuclei, and nearby white matter capsules.  No ventriculomegaly. No midline shift. Basilar cisterns are patent. No acute intracranial hemorrhage identified. No suspicious intracranial vascular hyperdensity.  IMPRESSION: 1. Subacute appearing left PCA infarct. Minimal to mild mass effect. No associated hemorrhage.  2.  Underlying advanced chronic small vessel ischemia.   Electronically Signed   By: Augusto Gamble M.D.   On: 06/26/2013 11:49   Mr Maxine Glenn Head Wo Contrast  06/26/2013   CLINICAL DATA:  Left-sided headache with right-sided weakness and numbness.  EXAM: MR BRAIN AND MRA HEAD WITHOUT CONTRAST  TECHNIQUE: Angiographic images of the Circle of Willis were obtained using MRA technique without intravenous contrast. MR brain without contrast was performed.  COMPARISON:  06/26/2013 head CT.  FINDINGS: MR brain:  Some of the sequences are motion degraded.  Acute/ subacute moderate size nonhemorrhagic infarct medial left temporal lobe, left occipital lobe and left thalamus.  Remote basal ganglia infarcts. Remote left caudate head infarct. Remote right coronal radiata infarct. Remote infarct left genu of the corpus callosum. Remote tiny left thalamic infarct. Small vessel disease type changes.  Global atrophy without hydrocephalus.  No intracranial hemorrhage.   No intracranial mass lesion noted on this unenhanced exam.  Cervical medullary junction, pituitary region, pineal region and orbital structures unremarkable.  MR cervical Willis:  Exam limited by motion. Grading stenosis accurately or evaluating for an aneurysm is limited.  Non visualization left posterior cerebral artery consistent with patient's acute infarct.  Moderate narrowing portions of the right posterior cerebral artery.  Left vertebral artery is dominant.  Mild to moderate narrowing mid aspect of the basilar artery.  Narrowing and irregularity of portions of the superior cerebellar artery bilaterally.  Within the anterior circulation, flow is noted within the internal carotid arteries and portions of the anterior cerebral arteries and middle cerebral arteries bilaterally. Evaluation otherwise limited.  IMPRESSION: Some of the sequences are motion degraded.  Acute/ subacute moderate size nonhemorrhagic infarct medial left temporal lobe, left occipital lobe and left thalamus.  Remote basal ganglia infarcts. Remote left caudate head  infarct. Remote right coronal radiata infarct. Remote infarct left genu of the corpus callosum. Remote tiny left thalamic infarct. Small vessel disease type changes.  Global atrophy without hydrocephalus.  No intracranial hemorrhage.  Non visualization left posterior cerebral artery consistent with patient's acute infarct.   Electronically Signed   By: Bridgett Larsson M.D.   On: 06/26/2013 21:36   Mr Brain Wo Contrast  06/26/2013   CLINICAL DATA:  Left-sided headache with right-sided weakness and numbness.  EXAM: MR BRAIN AND MRA HEAD WITHOUT CONTRAST  TECHNIQUE: Angiographic images of the Circle of Willis were obtained using MRA technique without intravenous contrast. MR brain without contrast was performed.  COMPARISON:  06/26/2013 head CT.  FINDINGS: MR brain:  Some of the sequences are motion degraded.  Acute/ subacute moderate size nonhemorrhagic infarct medial left temporal  lobe, left occipital lobe and left thalamus.  Remote basal ganglia infarcts. Remote left caudate head infarct. Remote right coronal radiata infarct. Remote infarct left genu of the corpus callosum. Remote tiny left thalamic infarct. Small vessel disease type changes.  Global atrophy without hydrocephalus.  No intracranial hemorrhage.  No intracranial mass lesion noted on this unenhanced exam.  Cervical medullary junction, pituitary region, pineal region and orbital structures unremarkable.  MR cervical Willis:  Exam limited by motion. Grading stenosis accurately or evaluating for an aneurysm is limited.  Non visualization left posterior cerebral artery consistent with patient's acute infarct.  Moderate narrowing portions of the right posterior cerebral artery.  Left vertebral artery is dominant.  Mild to moderate narrowing mid aspect of the basilar artery.  Narrowing and irregularity of portions of the superior cerebellar artery bilaterally.  Within the anterior circulation, flow is noted within the internal carotid arteries and portions of the anterior cerebral arteries and middle cerebral arteries bilaterally. Evaluation otherwise limited.  IMPRESSION: Some of the sequences are motion degraded.  Acute/ subacute moderate size nonhemorrhagic infarct medial left temporal lobe, left occipital lobe and left thalamus.  Remote basal ganglia infarcts. Remote left caudate head infarct. Remote right coronal radiata infarct. Remote infarct left genu of the corpus callosum. Remote tiny left thalamic infarct. Small vessel disease type changes.  Global atrophy without hydrocephalus.  No intracranial hemorrhage.  Non visualization left posterior cerebral artery consistent with patient's acute infarct.   Electronically Signed   By: Bridgett Larsson M.D.   On: 06/26/2013 21:36    CT of the brain  Subacute appearing left PCA infarct  MRI of the brain  Acute/ subacute moderate size nonhemorrhagic infarct medial left  temporal  lobe, left occipital lobe and left thalamus   MRA of the brain  Non visualization left posterior cerebral artery consistent with  patient's acute infarct.   2D Echocardiogram    Carotid Doppler    CXR    EKG  sinus  Therapy Recommendations pending  Physical Exam  Mental Status:  Alert, awake, oriented, thought content appropriate. Speech fluent without evidence of aphasia. Able to follow 3 step commands without difficulty.  Cranial Nerves:  II: Discs flat bilaterally; Visual fields grossly normal, pupils equal, round, reactive to light and accommodation  III,IV, VI: ptosis not present, extra-ocular motions intact bilaterally  V,VII: smile symmetric, facial light touch sensation normal bilaterally  VIII: hearing normal bilaterally  IX,X: gag reflex present  XI: bilateral shoulder shrug  XII: midline tongue extension without atrophy or fasciculations  Motor:  Mild right HP  Tone and bulk:normal tone throughout; no atrophy noted  Sensory: Pinprick and light touch intact throughout  impaired in the right side.  Deep Tendon Reflexes:  Right: Upper Extremity Left: Upper extremity  biceps (C-5 to C-6) 2/4 biceps (C-5 to C-6) 2/4  tricep (C7) 2/4 triceps (C7) 2/4  Brachioradialis (C6) 2/4 Brachioradialis (C6) 2/4  Lower Extremity Lower Extremity  quadriceps (L-2 to L-4) 2/4 quadriceps (L-2 to L-4) 2/4  Achilles (S1) 2/4 Achilles (S1) 2/4  Plantars:  Right: downgoing Left: downgoing  Cerebellar:  normal finger-to-nose, normal heel-to-shin test  Gait:  No tested.  CV: pulses palpable throughout    ASSESSMENT 72 y/o with recent onset HA and mild right hemiparesis. L PCA infarct as above    L PCA stroke  Hospital day # 1  TREATMENT/PLAN  Increase to full dose ASA of 325  con't pt/ot  Finish ret of stroke work up such as Tourist information centre manager  D/w pt's family at bedside who were used as translators  Annie Main, MSN, RN, ANVP-BC, ANP-BC, GNP-BC [facility] Pager: [phone] 06/27/2013 10:50 AM  I have personally obtained a history, examined the patient, evaluated imaging results, and formulated the assessment and plan of care. I agree with the above.

## 2013-06-28 MED ORDER — HYDROCHLOROTHIAZIDE 12.5 MG PO CAPS: 12.5000 mg | ORAL_CAPSULE | Freq: Every day | ORAL | Status: DC

## 2013-06-28 MED ORDER — AMLODIPINE BESYLATE 10 MG PO TABS
10.0000 mg | ORAL_TABLET | Freq: Every day | ORAL | Status: DC
  Administered 2013-06-28 – 2013-07-01 (×4): 10 mg via ORAL
  Filled 2013-06-28 (×4): qty 1

## 2013-06-28 NOTE — Progress Notes (Signed)
Stroke Team Progress Note  HISTORY Tim [name] is a 72 y.o. male with a past medical history significant for HTN, heavy smoking, brought to [facility] ED by family due to new onset HA and right sided weakness.  Patient doesn't speak English and all information is provided by family who expressed that Tim [name] started complaining of HA 2 days ago and then became " paralyzed in the right side" to the degree that walking has been a major struggle and he had a fall. No reported vertigo, double vision, slurred speech, language or vision impairment. 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  Patient was not a TPA candidate secondary to outside of TPA window.   SUBJECTIVE No specific complaints.  OBJECTIVE Most recent Vital Signs: Filed Vitals:   06/27/13 1802 06/27/13 2206 06/28/13 0100 06/28/13 0543  BP: 176/98 179/84 174/96 172/79  Pulse: 73 72 75 77  Temp: 98 F (36.7 C) 97.4 F (36.3 C) 97.7 F (36.5 C) 98 F (36.7 C)  TempSrc: Oral Oral Oral Oral  Resp: 20 20 19 18   Height:      Weight:      SpO2: 98% 98% 94% 100%   CBG (last 3)   Recent Labs  06/26/13 1153  GLUCAP 134*    IV Fluid Intake:   . sodium chloride 75 mL/hr at 06/26/13 1347    MEDICATIONS  . amLODipine  10 mg Oral Daily  . aspirin  325 mg Oral Daily  . atorvastatin  40 mg Oral q1800  . heparin subcutaneous  5,000 Units Subcutaneous Q8H   PRN:  acetaminophen, senna-docusate  Diet:  Cardiac  Activity:   Up with assistance DVT Prophylaxis:  Scds, ambulating - subcutaneous heparin  CLINICALLY SIGNIFICANT STUDIES Basic Metabolic Panel:   Recent Labs Lab 06/26/13 1205 06/26/13 1940 06/27/13 0946  NA 139  --  141  K 3.7  --  3.6  CL 102  --  108  CO2 25  --  22  GLUCOSE 100*  --  88  BUN 24*  --  26*  CREATININE 2.01* 1.99* 1.78*  CALCIUM 9.1  --  8.4   Liver Function Tests:   Recent Labs Lab 06/26/13 1205  AST 15  ALT 11  ALKPHOS 112  BILITOT 1.0  PROT 7.7  ALBUMIN 3.5   CBC:    Recent Labs Lab 06/26/13 1205 06/26/13 1940 06/27/13 0946  WBC 11.0* 10.6* 8.5  NEUTROABS 8.2*  --   --   HGB 16.7 15.7 14.2  HCT 47.8 45.5 40.8  MCV 86.8 88.3 88.1  PLT 337 326 311   Coagulation: No results found for this basename: LABPROT, INR,  in the last 168 hours Cardiac Enzymes:   Recent Labs Lab 06/26/13 1940 06/27/13 0143 06/27/13 0810  TROPONINI <0.30 <0.30 <0.30   Urinalysis:   Recent Labs Lab 06/26/13 1323  COLORURINE YELLOW  LABSPEC 1.024  PHURINE 6.0  GLUCOSEU NEGATIVE  HGBUR SMALL*  BILIRUBINUR NEGATIVE  KETONESUR NEGATIVE  PROTEINUR >300*  UROBILINOGEN 0.2  NITRITE NEGATIVE  LEUKOCYTESUR NEGATIVE   Lipid Panel    Component Value Date/Time   CHOL 216* 06/27/2013 0143   TRIG 143 06/27/2013 0143   HDL 39* 06/27/2013 0143   CHOLHDL 5.5 06/27/2013 0143   VLDL 29 06/27/2013 0143   LDLCALC 148* 06/27/2013 0143   HgbA1C  Lab Results  Component Value Date   HGBA1C 5.8* 06/26/2013    Urine Drug Screen:   No results found for this basename:  labopia,  cocainscrnur,  labbenz,  amphetmu,  thcu,  labbarb    Alcohol Level: No results found for this basename: ETH,  in the last 168 hours  Dg Chest 2 View 06/26/2013    1. No acute cardiopulmonary abnormality.  2. 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      Ct Head (brain) Wo Contrast 06/26/2013    1. Subacute appearing left PCA infarct. Minimal to mild mass effect. No associated hemorrhage.  2.  Underlying advanced chronic small vessel ischemia.    Mri / Mra Head Wo Contrast 06/26/2013    Some of the sequences are motion degraded.  Acute/ subacute moderate size nonhemorrhagic infarct medial left temporal lobe, left occipital lobe and left thalamus.  Remote basal ganglia infarcts. Remote left caudate head infarct. Remote right coronal radiata infarct. Remote infarct left genu of the corpus callosum. Remote tiny left thalamic infarct. Small vessel disease type changes.  Global atrophy without  hydrocephalus.  No intracranial hemorrhage.  Non visualization left posterior cerebral artery consistent with patient's acute infarct.     2D Echocardiogram  pending  Carotid Doppler Preliminary findings: Bilateral: 1-39% ICA stenosis. Vertebral artery flow is antegrade  EKG  sinus rhythm rate 68 beats per minute  Therapy Recommendations physical therapy recommends inpatient rehabilitation.  Physical Exam  Mental Status:  Alert, awake, oriented, thought content appropriate. Speech fluent without evidence of aphasia. Able to follow 3 step commands without difficulty.  Cranial Nerves:  II: Discs flat bilaterally; Visual fields grossly normal, pupils equal, round, reactive to light and accommodation  III,IV, VI: ptosis not present, extra-ocular motions intact bilaterally  V,VII: smile symmetric, facial light touch sensation normal bilaterally  VIII: hearing normal bilaterally  IX,X: gag reflex present  XI: bilateral shoulder shrug  XII: midline tongue extension without atrophy or fasciculations  Motor:  Mild right HP  Tone and bulk:normal tone throughout; no atrophy noted  Sensory: Pinprick and light touch intact throughout impaired in the right side.  Deep Tendon Reflexes:  Right: Upper Extremity Left: Upper extremity  biceps (C-5 to C-6) 2/4 biceps (C-5 to C-6) 2/4  tricep (C7) 2/4 triceps (C7) 2/4  Brachioradialis (C6) 2/4 Brachioradialis (C6) 2/4  Lower Extremity Lower Extremity  quadriceps (L-2 to L-4) 2/4 quadriceps (L-2 to L-4) 2/4  Achilles (S1) 2/4 Achilles (S1) 2/4  Plantars:  Right: downgoing Left: downgoing  Cerebellar:  normal finger-to-nose, normal heel-to-shin test  Gait:  No tested.  CV: pulses palpable throughout    ASSESSMENT 72 y/o with recent onset HA and mild right hemiparesis. MRI - acute/ subacute moderate size nonhemorrhagic infarct medial left temporal lobe, left occipital lobe and left thalamus with multiple remote infarcts. Infarct felt to be embolic  of uncertain etiology. The patient was on no antibiotic prior to admission. He is now on aspirin 325 mg daily. He continues to have mild right hemiparesis.   Hypertension  Renal insufficiency - improving  Hyperlipidemia - cholesterol 216 LDL 148 - now on Lipitor  Tobacco history  Hospital day # 2  TREATMENT/PLAN  Increase to full dose ASA of 325  Continue pt/ot - inpatient rehabilitation recommended. Rehabilitation M.D. consult pending.  Await 2-D echo.  Risk factor modification  TEE if TTE shows no thrombus in setting embolic strokes.  Consider outpatient telemetry and possible loop recorder.   Delton See PA-C [facility] Pager [phone] 06/28/2013, 9:41 AM  I have personally obtained a history, examined the patient, evaluated imaging results, and formulated the assessment and plan  of care. I agree with the above.  Pt has multiple b/l infarcts. Awaiting TTE if unrevealing would strongly consider TEE. Spoke to pt's son and answered his questions. Awaiting CIR evaluation.   Pauletta Browns

## 2013-06-28 NOTE — Progress Notes (Signed)
FMTS Attending Daily Note:  Renold Don MD  [phone] pager  Family Practice pager:  [phone] I have seen and examined this patient and have reviewed their chart. I have discussed this patient with the resident. I agree with the resident's findings, assessment and care plan.  Additionally:  Awaiting eval by CIR.  Otherwise doing well, stable. Permissive hypertension.  Recommend adding hydralazine for systolics > 180.  Tobey Grim, MD 06/28/2013 12:18 PM

## 2013-06-28 NOTE — Progress Notes (Signed)
Family Medicine Teaching Service Daily Progress Note Intern Pager: [phone]  Patient name: Tim [name] Medical record number: [id number] Date of birth: May 25, 1941 Age: 72 y.o. Gender: male  Primary Care Provider: No primary provider on file. Consultants: Neurology Code Status: Full  Pt Overview and Major Events to Date:  12/5 - CT / MRI confirmed subacute non-hemorrhagic L-PCA infarct 12/6 - pending PT/OT, ECHO, Carotid Dopplers. Repeat EKG (normal)  Assessment and Plan: Tim [name] is a 72 y.o. [place] male presenting with left sided headache and right sided weakness and numbness found to have a subacute left PCA infarct. PMH is significant for previous ischemic stroke in 2012 with minimal f/up, untreated HTN, HLD.  # Neuro:Subacute left PCA infarct/headache H/o prev ischemic strokes (post limb of the internal capsule, left external capsule and chronic lacunar infacts), not on medications and evidence of untreated HTN/HLD per chart review. No evidence of residual deficits per exam on admission apart from possible right sided numbness. However did not formally assess gait. Pt also with left sided hearing loss (but per family member this is chronic issue). Risk factors including smoking hx, HTN, HLD, no fmhx per family member. Elevated WBC 11.0 06/28/13. Doing well today w/o deficits but did not walk pt. PT recommending CIR  - Consult to CIR placed 06/28/13 - MRI/MRA brain (Acute/subacute mod size nonhemorrhagic infarct L-temporal, L-occipital, L-thalamus) - continue telemetry - frequent neuro checks - inc to ASA 325 daily - risk stratification labs- HgbA1c (5.8), Lipid panel (TC 216, TG 143, HDL 39, LDL 148), TSH (pending) - start Atorvastatin 40mg  daily - PT/OT eval and treat today - 2D echo and carotids - results pending   # Uncontrolled HTN Remains HTN after initiating amlodipine.  Denies chest pain but ST depression noted on EKG without lead pattern. CVA considered subacute, therefore  outside range of permissive HTN. Suspect pt has long standing untreated HTN which is clearly a risk factor in this case - f/u BP, with goal < 180 / 105 - repeat EKG - troponins (negative x3) - Inc to Amlodipine 10mg  daily, continue on discharge  # AKI on CKD vs progression of CKD - 2 years ago Cr 1.6, suspect has CKD, currently 1.78  - tolerating PO so will KVO IVF - trend with BMETs  - avoid nephrotoxic agents  # h/o Gout Treated with allopurinol at home  - will Hold due to elevated Cr  FEN/GI: heart healthy, passed bedside swallow study  Prophylaxis: SQ Heparin  Disposition: PT recommending CIR so will wait their evaluation  Subjective: Doing well today. No complaints. Still feeling week from stroke and would like to meet with PT.   Objective: Temp:  [97.4 F (36.3 C)-98.2 F (36.8 C)] 98 F (36.7 C) (12/07 0543) Pulse Rate:  [72-77] 77 (12/07 0543) Resp:  [18-20] 18 (12/07 0543) BP: (165-179)/(79-98) 172/79 mmHg (12/07 0543) SpO2:  [94 %-100 %] 100 % (12/07 0543) Physical Exam: General: sleeping comfortably, easily woken, interviewed with assistance of Son as interpreter, NAD Cardiovascular: RRR, II/VI systolic murmur Respiratory: CTAB, normal work of breathing Abdomen: soft, NTND, +active BS Extremities: non-tender, no edema, +2 peripheral pulses Neuro: awake, alert, oriented w/o confusion this AM, CN-II-XII intact, exam grossly non-focal, muscle strength 5/5 equal b/l in upper and lower extremities, distal sensation to light touch intact w/ resolved R-numbness.  Laboratory:  Recent Labs Lab 06/26/13 1205 06/26/13 1940 06/27/13 0946  WBC 11.0* 10.6* 8.5  HGB 16.7 15.7 14.2  HCT 47.8 45.5 40.8  PLT 337 326  311    Recent Labs Lab 06/26/13 1205 06/26/13 1940 06/27/13 0946  NA 139  --  141  K 3.7  --  3.6  CL 102  --  108  CO2 25  --  22  BUN 24*  --  26*  CREATININE 2.01* 1.99* 1.78*  CALCIUM 9.1  --  8.4  PROT 7.7  --   --   BILITOT 1.0  --   --    ALKPHOS 112  --   --   ALT 11  --   --   AST 15  --   --   GLUCOSE 100*  --  88   Troponin (negative x 3)  Imaging/Diagnostic Tests:  12/5 CXR IMPRESSION:  1. No acute cardiopulmonary abnormality.  2. 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 Subacute appearing left PCA infarct. Minimal to mild mass effect.  No associated hemorrhage.  2. Underlying advanced chronic small vessel ischemia.  12/5 MRI Brain / MRA Head IMPRESSION:  Some of the sequences are motion degraded.  Acute/ subacute moderate size nonhemorrhagic infarct medial left  temporal lobe, left occipital lobe and left thalamus.  Remote basal ganglia infarcts. Remote left caudate head infarct.  Remote right coronal radiata infarct. Remote infarct left genu of  the corpus callosum. Remote tiny left thalamic infarct. Small vessel  disease type changes.  Global atrophy without hydrocephalus.  No intracranial hemorrhage.  Non visualization left posterior cerebral artery consistent with  patient's acute infarct.  12/6 EKG NSR, HR 91, non-specific ST-depression  12/6 Repeat EKG NSR, HR 68, no acute findings, resolved ST-depression  Ozella Rocks, MD 06/28/2013, 8:16 AM PGY-3, [facility] FPTS Intern pager: [phone], text pages welcome

## 2013-06-29 DIAGNOSIS — R4189 Other symptoms and signs involving cognitive functions and awareness: Secondary | ICD-10-CM | POA: Diagnosis present

## 2013-06-29 DIAGNOSIS — F09 Unspecified mental disorder due to known physiological condition: Secondary | ICD-10-CM

## 2013-06-29 LAB — BASIC METABOLIC PANEL
BUN: 29 mg/dL — ABNORMAL HIGH (ref 6–23)
CO2: 22 mEq/L (ref 19–32)
Creatinine, Ser: 1.76 mg/dL — ABNORMAL HIGH (ref 0.50–1.35)
GFR calc Af Amer: 43 mL/min — ABNORMAL LOW (ref >90)
GFR calc non Af Amer: 37 mL/min — ABNORMAL LOW (ref >90)
Sodium: 138 mEq/L (ref 135–145)

## 2013-06-29 NOTE — Progress Notes (Addendum)
Met with pt and his two sons who were interpreting. He will have 24/7 assist at home. TEE is pending. I discussed his deficits with PT and OT and will give an update to Dr. Riley Kill to determine if inpt rehab vs [facility] at d/c. [phone]

## 2013-06-29 NOTE — Progress Notes (Signed)
Family Medicine Teaching Service Daily Progress Note Intern Pager: [phone]  Patient name: Tim [name] Medical record number: [id number] Date of birth: [birth year] Age: 72 y.o. Gender: male  Primary Care Provider: No primary provider on file. Consultants: Neurology Code Status: Full  Pt Overview and Major Events to Date:  12/5 - CT / MRI confirmed subacute non-hemorrhagic L-PCA infarct 12/6 - pending PT/OT, ECHO, Carotid Dopplers. Repeat EKG (normal)  Assessment and Plan: Tim [name] is a 72 y.o. [place] male presenting with left sided headache and right sided weakness and numbness found to have a subacute left PCA infarct. PMH is significant for previous ischemic stroke in 2012 with minimal f/up, untreated HTN, HLD.  # Neuro:Subacute left PCA infarct/headache H/o prev ischemic strokes (post limb of the internal capsule, left external capsule and chronic lacunar infacts), not on medications and evidence of untreated HTN/HLD per chart review. No evidence of residual deficits per exam on admission apart from possible right sided numbness. However did not formally assess gait. Pt also with left sided hearing loss (but per family member this is chronic issue). Risk factors including smoking hx, HTN, HLD, no fmhx per family member. Elevated WBC 11.0 - MRI/MRA brain (Acute/subacute mod size nonhemorrhagic infarct L-temporal, L-occipital, L-thalamus) - ECHO, Carotids normal - frequent neuro checks - inc to ASA 325 daily - risk stratification labs- HgbA1c (5.8), Lipid panel (TC 216, TG 143, HDL 39, LDL 148), TSH (pending) - start Atorvastatin 40mg  daily - PT/OT eval --> recommend CIR (will likely be placed tomorrow 12/9 after TEE) - per stroke team recommendations: requested TEE for further eval of embolic source, with loop monitor to follow - contacted Cardiology for scheduled placement of TEE tomorrow morning 12/9  # Malignant HTN, poorly controlled Improved BP on Amlodipine. Stable today  135/80 Denies chest pain but ST depression noted on initial EKG without lead pattern. CVA considered subacute, therefore outside range of permissive HTN. Suspect pt has long standing untreated HTN which is clearly a risk factor in this case - f/u BP, with goal < 180 / 105 - repeat EKG improved - troponins (negative x3) - Amlodipine 10mg  daily  # Encephalopathy, suspected secondary to h/o multiple CVA vs hospital setting Reported confusion by patient's son. Remains oriented to person, place, and day, but talking with his sons they believe he is intermittently confused. - continue orienting patient daily  # AKI on CKD vs progression of CKD - 2 years ago Cr 1.6, suspect has CKD, currently 1.78--1.76 - tolerating PO so will KVO IVF - trend with BMETs  - avoid nephrotoxic agents  # h/o Gout Treated with allopurinol at home  - will Hold due to elevated Cr  FEN/GI: heart healthy, passed bedside swallow study  Prophylaxis: SQ Heparin  Disposition: PT recommending CIR so will wait their evaluation  Subjective: Talked to patient via son as Nurse, learning disability. He reports that he is doing well. He is ready to go home, but understands taht he needs to stay in the hospital for continued rehabilitation. Reported that he was able to stand and go to bathroom on his own, however still weak while walking and needs assistance. Good appetite.  Objective: Temp:  [97.3 F (36.3 C)-97.8 F (36.6 C)] 97.6 F (36.4 C) (12/08 0619) Pulse Rate:  [58-94] 85 (12/08 0619) Resp:  [18-20] 18 (12/08 0619) BP: (133-187)/(72-105) 135/80 mmHg (12/08 0619) SpO2:  [90 %-100 %] 96 % (12/08 1478) Physical Exam: General: resting in bed, interviewed with assistance of Son as interpreter, NAD Cardiovascular:  RRR, II/VI systolic murmur Respiratory: CTAB, normal work of breathing Abdomen: soft, NTND, +active BS Extremities: non-tender, no edema, +2 peripheral pulses Neuro: awake, alert, oriented to person, day, and place,  CN-II-XII intact, exam grossly non-focal, muscle strength 5/5 equal b/l in upper and lower extremities, distal sensation to light touch intact w/ resolved R-numbness.  Laboratory:  Recent Labs Lab 06/26/13 1205 06/26/13 1940 06/27/13 0946  WBC 11.0* 10.6* 8.5  HGB 16.7 15.7 14.2  HCT 47.8 45.5 40.8  PLT 337 326 311    Recent Labs Lab 06/26/13 1205 06/26/13 1940 06/27/13 0946 06/29/13 0536  NA 139  --  141 138  K 3.7  --  3.6 3.5  CL 102  --  108 105  CO2 25  --  22 22  BUN 24*  --  26* 29*  CREATININE 2.01* 1.99* 1.78* 1.76*  CALCIUM 9.1  --  8.4 8.7  PROT 7.7  --   --   --   BILITOT 1.0  --   --   --   ALKPHOS 112  --   --   --   ALT 11  --   --   --   AST 15  --   --   --   GLUCOSE 100*  --  88 91   Troponin (negative x 3)  Imaging/Diagnostic Tests:  12/5 CXR IMPRESSION:  1. No acute cardiopulmonary abnormality.  2. 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 Subacute appearing left PCA infarct. Minimal to mild mass effect.  No associated hemorrhage.  2. Underlying advanced chronic small vessel ischemia.  12/5 MRI Brain / MRA Head IMPRESSION:  Some of the sequences are motion degraded.  Acute/ subacute moderate size nonhemorrhagic infarct medial left  temporal lobe, left occipital lobe and left thalamus.  Remote basal ganglia infarcts. Remote left caudate head infarct.  Remote right coronal radiata infarct. Remote infarct left genu of  the corpus callosum. Remote tiny left thalamic infarct. Small vessel  disease type changes.  Global atrophy without hydrocephalus.  No intracranial hemorrhage.  Non visualization left posterior cerebral artery consistent with  patient's acute infarct.  12/6 EKG NSR, HR 91, non-specific ST-depression  12/6 Repeat EKG NSR, HR 68, no acute findings, resolved ST-depression  12/6 2D ECHO Left ventricle: The cavity size was normal. Wall thickness was normal. Systolic  function was normal. The estimated ejection fraction was in the range of 55% to 60%. Wall motion was normal; there were no regional wall motion abnormalities. Doppler parameters are consistent with abnormal left ventricular relaxation (grade 1 diastolic dysfunction). - Aortic valve: Trileaflet; mildly thickened leaflets. Mild regurgitation. - Atrial septum: No defect or patent foramen ovale was Identified.  12/6 Carotid Dopplers Preliminary findings 1-39% ICA stenosis, Negative  Saralyn Pilar, DO 06/29/2013, 7:24 AM PGY-1, [facility] FPTS Intern pager: [phone], text pages welcome

## 2013-06-29 NOTE — Evaluation (Signed)
Occupational Therapy Evaluation Patient Details Name: Tim [name] MRN: [id number] DOB: 1941-03-31 Today's Date: 06/29/2013 Time: 0931-1000 OT Time Calculation (min): 29 min  OT Assessment / Plan / Recommendation History of present illness Tim [name] is an 72 y.o. male with a past medical history significant for HTN, heavy smoking, brought to [facility] ED by family due to new onset HA and right sided weakness.  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 Remote basal ganglia infarcts. Remote left caudate head infarct. Remote right coronal radiata infarct. Remote infarct left genu of the corpus callosum. Remote tiny left thalamic infarct. Small vessel disease type changes.    Clinical Impression   PT admitted with acute Lt temporal, lt occipital and Lt thalmas infarct. Pt currently with functional limitiations due to the deficits listed below (see OT problem list).  Pt will benefit from skilled OT to increase their independence and safety with adls and balance to allow discharge CIR. Pt with visual changes and with strong family support.      OT Assessment  Patient needs continued OT Services    Follow Up Recommendations  CIR    Barriers to Discharge      Equipment Recommendations  None recommended by OT    Recommendations for Other Services Rehab consult  Frequency  Min 3X/week    Precautions / Restrictions Precautions Precautions: Fall   Pertinent Vitals/Pain None reported Vision changes see below    ADL  Grooming: Teeth care;Wash/dry face;Wash/dry hands;Shaving;Moderate assistance (ideational apraxia) Where Assessed - Grooming: Unsupported standing Toilet Transfer: Min Pension scheme manager Method: Sit to Barista: Regular height toilet;Grab bars Toileting - Clothing Manipulation and Hygiene: Min guard Where Assessed - Toileting Clothing Manipulation and Hygiene: Sit to stand from  3-in-1 or toilet Transfers/Ambulation Related to ADLs: pt ambulating to the bathroom with bed alarm sounding. Son in room talking to staff on speaker due to alarm and pt ambulating unsteady alone toward bathroom. Pt unsteady with slight sway to static standing.  ADL Comments: Pt completed toileting with incr time. Pt required UE on sink surface for balance. Pt applying tooth paste all over the tooth brush due to undershooting. pt attempting to shave face with tooth brush. Son translating each statement with cues from therapsit. Therapist educating son its very important that we know exactly what he is saying. Son laughs at patient and states "he say he shave he face with that and I tell him no teeth." Pt self reports "confused" to son. Pt demonstrates ideational apraxia. pt provided shaving cream to shave face and attempting to put it in his hair. Pt needed cues to shave face. pt educated on "sharp' "Be careful" . pt shaving very slowly . Pt states to son "i shave slow not like when I was a boy" Pt needed mod (A) to don new gown. Pt with visual deficits. See below    OT Diagnosis: Generalized weakness;Cognitive deficits;Disturbance of vision;Altered mental status  OT Problem List: Decreased strength;Decreased activity tolerance;Impaired balance (sitting and/or standing);Decreased coordination;Decreased cognition;Decreased safety awareness;Decreased knowledge of use of DME or AE;Decreased knowledge of precautions;Impaired vision/perception OT Treatment Interventions: Self-care/ADL training;Therapeutic exercise;Neuromuscular education;DME and/or AE instruction;Therapeutic activities;Cognitive remediation/compensation;Visual/perceptual remediation/compensation;Patient/family education;Balance training   OT Goals(Current goals can be found in the care plan section) Acute Rehab OT Goals Patient Stated Goal: to go home, to smoke per son translation OT Goal Formulation: With patient/family Time For Goal  Achievement: 07/13/13 Potential to Achieve Goals: Good  Visit Information  Last OT Received On: 06/29/13 Assistance Needed: +1 History of Present Illness: Tim [name] is an 72 y.o. male with a past medical history significant for HTN, heavy smoking, brought to [facility] ED by family due to new onset HA and right sided weakness.         Prior Functioning     Home Living Family/patient expects to be discharged to:: Private residence Living Arrangements: Children Available Help at Discharge: Family Type of Home: House Home Access: Stairs to enter Secretary/administrator of Steps: 3 Entrance Stairs-Rails: None Home Layout: One level Home Equipment: None Prior Function Level of Independence: Independent Communication Communication: Prefers language other than English;Other (comment) Dominant Hand: Right         Vision/Perception Vision - History Baseline Vision: Wears glasses only for reading Patient Visual Report: Peripheral vision impairment;Undershooting;Blurring of vision Vision - Assessment Eye Alignment: Within Functional Limits Vision Assessment: Vision tested Ocular Range of Motion: Restricted on the right;Impaired-to be further tested in functional context Tracking/Visual Pursuits: Decreased smoothness of eye movement to RIGHT superior field;Decreased smoothness of eye movement to RIGHT inferior field;Requires cues, head turns, or add eye shifts to track;Impaired - to be further tested in functional context Visual Fields: Right homonymous hemianopsia;Other (comment);Impaired - to be further tested in functional context Additional Comments: Son translating to patient and therapist holding head in midline. pt demonstrates deficits tracking Rt superior and inferior . Pt states "i can't see " Pt with peripheral deficits.Pt next with Rt eye occluded. Pt unable to see therapist "whole" Pt states "i see half" Pt unable to see therapist completely when therapist standing in midline. Pt  needs constant redirection to task and demonstrates sustain attention ~15- 30 seconds. Therapist needed to repeat several questions and assessment portions to confirm deficits. Pt states "my vision not like when I was a boy" Pt attempting to tell stories to son when reporting deficits. Pt's story telling when reporting deficits makes assessment difficult at times. Pt with peripheral right deficits noted during functional task   Cognition  Cognition Arousal/Alertness: Awake/alert Behavior During Therapy: WFL for tasks assessed/performed Overall Cognitive Status: Difficult to assess    Extremity/Trunk Assessment Upper Extremity Assessment Upper Extremity Assessment: Overall WFL for tasks assessed (under shooting on the right side) Lower Extremity Assessment Lower Extremity Assessment: Defer to PT evaluation     Mobility Bed Mobility Bed Mobility: Not assessed Transfers Transfers: Sit to Stand;Stand to Sit Sit to Stand: 4: Min guard;With upper extremity assist;From bed Stand to Sit: 4: Min guard;With upper extremity assist;To bed Details for Transfer Assistance: pt unsteady and using bil LE against bed for static standing. pt with LOB but able to self correct using bed as support     Exercise     Balance     End of Session OT - End of Session Activity Tolerance: Patient tolerated treatment well Patient left: in bed;with call bell/phone within reach;with family/visitor present Nurse Communication: Mobility status;Precautions  GO     Harolyn Rutherford 06/29/2013, 10:15 AM  Pager: [phone]

## 2013-06-29 NOTE — Clinical Documentation Improvement (Signed)
  Per chart this patient has "uncontrolled HTN" with CRF and current CVA. Please clarify this further if possible to illustrate the severity of illness and risk of mortality. Thank you.  Possible Clinical Conditions?  - Accelerated Hypertension - Malignant Hypertension - Other Condition (please specify)   Thank You, Beverley Fiedler ,RN Clinical Documentation Specialist:  [phone]  [facility]- Health Information Management

## 2013-06-29 NOTE — Progress Notes (Signed)
Physical Therapy Treatment Patient Details Name: Tim [name] MRN: [id number] DOB: [birth year] Today's Date: 06/29/2013 Time: 0102-7253 PT Time Calculation (min): 23 min  PT Assessment / Plan / Recommendation  History of Present Illness Tim [name] is an 72 y.o. male with a past medical history significant for HTN, heavy smoking, brought to [facility] ED by family due to new onset HA and right sided weakness.     PT Comments   Patient with some improvements in functional mobility but continues to present with increased need for assist.  Pt continues to remain high fall risk 2/2  Significant ataxia with ambulation, difficulty coordinating RLE for gait, and continued cognitive deficits.  Patient with assist and significant reliance on furniture and surfaces for in room gait. Per pt son, patient continues to present with some confusion and is very different from pre-morbid state. Will continue to see patient and progress activity as tolerated. Continue to feel patient is an ideal candidate for CIR upon discharge.   Follow Up Recommendations  CIR           Equipment Recommendations  Other (comment) (TBD)    Recommendations for Other Services Rehab consult  Frequency Min 4X/week   Progress towards PT Goals Progress towards PT goals: Progressing toward goals  Plan Current plan remains appropriate    Precautions / Restrictions Precautions Precautions: Fall Restrictions Weight Bearing Restrictions: No   Pertinent Vitals/Pain Patient reports no pain at this time    Mobility  Bed Mobility Bed Mobility: Supine to Sit;Sitting - Scoot to Edge of Bed Supine to Sit: 5: Supervision Sitting - Scoot to Edge of Bed: 5: Supervision Transfers Transfers: Sit to Stand;Stand to Sit Sit to Stand: 4: Min guard;With upper extremity assist;From bed Stand to Sit: 4: Min guard;With upper extremity assist;To bed Details for Transfer Assistance: pt unsteady and using bil LE against bed for static standing. pt with LOB but  able to self correct using bed as support Ambulation/Gait Ambulation/Gait Assistance: 4: Min assist;3: Mod assist Ambulation Distance (Feet): 210 Feet Assistive device: 1 person hand held assist Ambulation/Gait Assistance Details: pt with very ataxic gait with multiple LOB to the right requiring increased assist Gait Pattern: Step-through pattern;Decreased stride length;Right steppage;Scissoring;Ataxic;Narrow base of support Gait velocity: decreased General Gait Details: Very unsteady gait      PT Goals (current goals can now be found in the care plan section) Acute Rehab PT Goals Patient Stated Goal: to go home, to smoke per son translation PT Goal Formulation: With patient/family Time For Goal Achievement: 07/11/13 Potential to Achieve Goals: Good  Visit Information  Last PT Received On: 06/29/13 Assistance Needed: +1 History of Present Illness: Tim [name] is an 72 y.o. male with a past medical history significant for HTN, heavy smoking, brought to [facility] ED by family due to new onset HA and right sided weakness.      Subjective Data  Patient Stated Goal: to go home, to smoke per son translation   Cognition  Cognition Arousal/Alertness: Awake/alert Behavior During Therapy: WFL for tasks assessed/performed Overall Cognitive Status: Difficult to assess    Balance  Static Standing Balance Static Standing - Balance Support: During functional activity Static Standing - Level of Assistance: 5: Stand by assistance  End of Session PT - End of Session Equipment Utilized During Treatment: Gait belt Activity Tolerance: Patient tolerated treatment well Patient left: in bed;with call bell/phone within reach;with family/visitor present Nurse Communication: Mobility status   GP     Fabio Asa 06/29/2013, 11:45 AM  Alben Deeds, [place] DPT  [phone]

## 2013-06-29 NOTE — Progress Notes (Signed)
Rehab Admissions Coordinator Note:  Patient was screened by Trish Mage for appropriateness for an Inpatient Acute Rehab Consult.  At this time, an inpatient rehab consult has been ordered and is pending completion.  Tim [name], Tim [name] 06/29/2013, 8:26 AM  I can be reached at [phone].

## 2013-06-29 NOTE — Progress Notes (Signed)
Stroke Team Progress Note  HISTORY Tim [name] is a 72 y.o. male with a past medical history significant for HTN, heavy smoking, brought to [facility] ED D. 06/26/2013 by family due to new onset HA and right sided weakness. Patient doesn't speak English and all information is provided by family who expressed that [name] started complaining of HA 2 days ago and then became " paralyzed in the right side" to the degree that walking has been a major struggle and he had a fall. No reported vertigo, double vision, slurred speech, language or vision impairment. 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  Patient was not a TPA candidate secondary to outside of TPA window.   SUBJECTIVE Son was at the bedside. He currently provides care for his father at home.  OBJECTIVE Most recent Vital Signs: Filed Vitals:   06/28/13 2208 06/29/13 0148 06/29/13 0619 06/29/13 1011  BP: 139/87 151/81 135/80 160/102  Pulse: 65 89 85 80  Temp: 97.8 F (36.6 C) 97.7 F (36.5 C) 97.6 F (36.4 C) 97.6 F (36.4 C)  TempSrc: Oral Oral Oral Oral  Resp: 18 18 18 17   Height:      Weight:      SpO2: 90% 92% 96% 100%   CBG (last 3)   Recent Labs  06/26/13 1153  GLUCAP 134*    IV Fluid Intake:   . sodium chloride 75 mL/hr at 06/26/13 1347    MEDICATIONS  . amLODipine  10 mg Oral Daily  . aspirin  325 mg Oral Daily  . atorvastatin  40 mg Oral q1800  . heparin subcutaneous  5,000 Units Subcutaneous Q8H   PRN:  acetaminophen, senna-docusate  Diet:  Cardiac thin liquids Activity:   Up with assistance DVT Prophylaxis:  Scds, ambulating - subcutaneous heparin  CLINICALLY SIGNIFICANT STUDIES Basic Metabolic Panel:   Recent Labs Lab 06/27/13 0946 06/29/13 0536  NA 141 138  K 3.6 3.5  CL 108 105  CO2 22 22  GLUCOSE 88 91  BUN 26* 29*  CREATININE 1.78* 1.76*  CALCIUM 8.4 8.7   Liver Function Tests:   Recent Labs Lab 06/26/13 1205  AST 15  ALT 11  ALKPHOS 112  BILITOT 1.0  PROT 7.7   ALBUMIN 3.5   CBC:   Recent Labs Lab 06/26/13 1205 06/26/13 1940 06/27/13 0946  WBC 11.0* 10.6* 8.5  NEUTROABS 8.2*  --   --   HGB 16.7 15.7 14.2  HCT 47.8 45.5 40.8  MCV 86.8 88.3 88.1  PLT 337 326 311   Coagulation: No results found for this basename: LABPROT, INR,  in the last 168 hours Cardiac Enzymes:   Recent Labs Lab 06/26/13 1940 06/27/13 0143 06/27/13 0810  TROPONINI <0.30 <0.30 <0.30   Urinalysis:   Recent Labs Lab 06/26/13 1323  COLORURINE YELLOW  LABSPEC 1.024  PHURINE 6.0  GLUCOSEU NEGATIVE  HGBUR SMALL*  BILIRUBINUR NEGATIVE  KETONESUR NEGATIVE  PROTEINUR >300*  UROBILINOGEN 0.2  NITRITE NEGATIVE  LEUKOCYTESUR NEGATIVE   Lipid Panel    Component Value Date/Time   CHOL 216* 06/27/2013 0143   TRIG 143 06/27/2013 0143   HDL 39* 06/27/2013 0143   CHOLHDL 5.5 06/27/2013 0143   VLDL 29 06/27/2013 0143   LDLCALC 148* 06/27/2013 0143   HgbA1C  Lab Results  Component Value Date   HGBA1C 5.8* 06/26/2013    Urine Drug Screen:   No results found for this basename: labopia,  cocainscrnur,  labbenz,  amphetmu,  thcu,  labbarb  Alcohol Level: No results found for this basename: ETH,  in the last 168 hours  Dg Chest 2 View  06/26/2013    1. No acute cardiopulmonary abnormality.  2. 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     Ct Head (brain) Wo Contrast 06/26/2013   1. Subacute appearing left PCA infarct. Minimal to mild mass effect. No associated hemorrhage.  2.  Underlying advanced chronic small vessel ischemia.    Mri / Mra Head Wo Contrast 06/26/2013    Some of the sequences are motion degraded.  Acute/ subacute moderate size nonhemorrhagic infarct medial left temporal lobe, left occipital lobe and left thalamus.  Remote basal ganglia infarcts. Remote left caudate head infarct. Remote right coronal radiata infarct. Remote infarct left genu of the corpus callosum. Remote tiny left thalamic infarct. Small vessel disease type changes.  Global  atrophy without hydrocephalus.  No intracranial hemorrhage.  Non visualization left posterior cerebral artery consistent with patient's acute infarct.    2D Echocardiogram  EF 55-60% with no source of embolus.   Carotid Doppler Bilateral: 1-39% ICA stenosis. Vertebral artery flow is antegrade  EKG  sinus rhythm rate 68 beats per minute  Therapy Recommendations inpatient rehabilitation.  Physical Exam  Mental Status:  Alert, awake, oriented, thought content appropriate. Speech fluent without evidence of aphasia. Able to follow 3 step commands without difficulty.  Cranial Nerves:  II: Discs flat bilaterally; Visual fields grossly normal, pupils equal, round, reactive to light and accommodation  III,IV, VI: ptosis not present, extra-ocular motions intact bilaterally  V,VII: smile symmetric, facial light touch sensation normal bilaterally  VIII: hearing normal bilaterally  IX,X: gag reflex present  XI: bilateral shoulder shrug  XII: midline tongue extension without atrophy or fasciculations  Motor:  Mild right HP  Tone and bulk:normal tone throughout; no atrophy noted  Sensory: Pinprick and light touch intact throughout impaired in the right side.  Deep Tendon Reflexes:  Right: Upper Extremity Left: Upper extremity  biceps (C-5 to C-6) 2/4 biceps (C-5 to C-6) 2/4  tricep (C7) 2/4 triceps (C7) 2/4  Brachioradialis (C6) 2/4 Brachioradialis (C6) 2/4  Lower Extremity Lower Extremity  quadriceps (L-2 to L-4) 2/4 quadriceps (L-2 to L-4) 2/4  Achilles (S1) 2/4 Achilles (S1) 2/4  Plantars:  Right: downgoing Left: downgoing  Cerebellar:  normal finger-to-nose, normal heel-to-shin test  Gait:  No tested.  CV: pulses palpable throughout    ASSESSMENT 72 y/o with recent onset HA and mild right hemiparesis. MRI - acute/ subacute moderate size nonhemorrhagic infarct medial left temporal lobe, left occipital lobe and left thalamus with multiple remote infarcts. Infarct felt to be embolic of  uncertain etiology. The patient was on no antibiotic prior to admission. He is now on aspirin 325 mg daily. He continues to have mild right hemiparesis. IP rehab recommended.   Hypertension  Renal insufficiency - improving  Hyperlipidemia - cholesterol 216 LDL 148 - now on Lipitor  Tobacco history  Hospital day # 3  TREATMENT/PLAN  Increase to full dose ASA of 325 mg daily for secondary stroke prevention  Rehabilitation consult  Recommend TEE to look for embolic source. Please arrange with pts cardiologist or cardiologist of choice (recommend calling joint group). . If positive for PFO (patent foramen ovale), check bilateral lower extremity venous dopplers to rule out DVT as possible source of stroke. I spoke with T/S team and gave direction. If TEE negative, [place] cardiologist will place implantable loop recorder to evaluate for atrial fibrillation as etiology of stroke. This has  been explained to patient/family by Dr. Pearlean Brownie and they are agreeable.   Annie Main, MSN, RN, ANVP-BC, ANP-BC, [facility] Pager: [phone] 06/29/2013 11:50 AM  I have personally obtained a history, examined the patient, evaluated imaging results, and formulated the assessment and plan of care. I agree with the above. Delia Heady, MD

## 2013-06-29 NOTE — Progress Notes (Signed)
I have seen and examined this patient. I have discussed with Dr Malachi Paradise.  I agree with their findings and plans as documented in their progress note.  Pt was unable to give day, month, year, building, county, state, unable to register 3 objects, unable to name common objects.  This brief assessment was performed with assistance of his grandson interpreting.  I suspect that Tim [name] has significant cognitive impairment.  He has impaired IADLs, so he may have dementia, vascular type.

## 2013-06-29 NOTE — Clinical Documentation Improvement (Signed)
  Multiple Notes indicate intermittent "confusion". In the Coding world this term is nonspecific and low weighted. If possible please clarify this term to illustrate this patient's severity of illness and risk of mortality. Thank you.  Possible Clinical Conditions?  - Encephalopathy (describe type if known)                       Anoxic                       Septic                       Alcoholic                        Hepatic                       Hypertensive                       Metabolic                       Toxic                        Due to current condition  - Hyponatremia / Hypernatremia - Poisoning/Overdose - Other Condition  Supporting Information: - Risk Factors: current CVA  Thank You, Beverley Fiedler ,RN Clinical Documentation Specialist:  [phone]  [facility]- Health Information Management

## 2013-06-29 NOTE — Consult Note (Signed)
Physical Medicine and Rehabilitation Consult Reason for Consult: CVA Referring Physician: Family medicine   HPI: Tim [name] is a 72 y.o. right-handed male with history of hypertension, tobacco abuse as well as previous ischemic infarct 2012 with little residual deficits. Presented 06/26/2013 with reported new onset of headache and right-sided weakness. MRI of the brain showed acute subacute moderate sized non-hemorrhagic infarct medial left temporal lobe, left occipital lobe and thalamus. Also remote basal ganglia infarcts as well as remote right corona radiata infarct. Echocardiogram with ejection fraction of 60% grade 1 diastolic dysfunction. Carotid Dopplers with no ICA stenosis. Patient did not receive TPA. Neurology services consulted placed on aspirin for CVA prophylaxis. Patient on no anti-coagulation prior to admission. Subcutaneous heparin added for DVT prophylaxis. Patient is tolerating a regular consistency diet. Physical therapy evaluation completed 06/27/2013 with recommendations of physical medicine rehabilitation consult to consider inpatient rehabilitation services.   Review of Systems  Unable to perform ROS: language  Cardiovascular: Positive for leg swelling.  All other systems reviewed and are negative.   Past Medical History  Diagnosis Date  . Hypertension    History reviewed. No pertinent past surgical history. History reviewed. No pertinent family history. Social History:  reports that he has been smoking.  He does not have any smokeless tobacco history on file. He reports that he does not drink alcohol. His drug history is not on file. Allergies: No Known Allergies Medications Prior to Admission  Medication Sig Dispense Refill  . allopurinol (ZYLOPRIM) 100 MG tablet Take 100 mg by mouth 3 (three) times daily as needed (for pain (gout flare)).      . naproxen (NAPROSYN) 500 MG tablet Take 500 mg by mouth 2 (two) times daily as needed (for pain).        Home: Home  Living Family/patient expects to be discharged to:: Private residence Living Arrangements: Children Available Help at Discharge: Family Type of Home: House Home Access: Stairs to enter Secretary/administrator of Steps: 3 Entrance Stairs-Rails: None Home Layout: One level Home Equipment: None  Functional History:   Functional Status:  Mobility: Bed Mobility Bed Mobility: Supine to Sit;Sitting - Scoot to Edge of Bed Supine to Sit: 4: Min assist Sitting - Scoot to [place] of Bed: 4: Min assist Transfers Transfers: Sit to Stand;Stand to Sit Sit to Stand: 3: Mod assist Stand to Sit: 4: Min assist Ambulation/Gait Ambulation/Gait Assistance: 3: Mod assist;2: Max Environmental consultant (Feet): 90 Feet Assistive device: 1 person hand held assist Ambulation/Gait Assistance Details: Very un coordinated and ataxic, multiple LOB requiring increased assist to prevent fall, serpent like movement with RLE (non-voluntary)  Gait Pattern: Step-through pattern;Decreased stride length;Right steppage;Scissoring;Ataxic;Narrow base of support Gait velocity: decreased General Gait Details: Very unsteady gait    ADL:    Cognition: Cognition Overall Cognitive Status: Difficult to assess Orientation Level: Oriented to person;Oriented to place;Disoriented to time;Oriented to situation (intermittent confusion per family, HOH, needs interpreter) Cognition Arousal/Alertness: Awake/alert Behavior During Therapy: WFL for tasks assessed/performed Overall Cognitive Status: Difficult to assess Difficult to assess due to: Impaired communication;Non-English speaking  Blood pressure 151/81, pulse 89, temperature 97.7 F (36.5 C), temperature source Oral, resp. rate 18, height 5\' 5"  (1.651 m), weight 58.968 kg (130 lb), SpO2 92.00%. Physical Exam  Nursing note reviewed. HENT:  Head: Normocephalic.  Eyes: EOM are normal.  Neck: Normal range of motion. Neck supple. No thyromegaly present.  Cardiovascular:  Normal rate and regular rhythm.   Respiratory: Effort normal and breath sounds normal. No respiratory distress.  GI: Soft. Bowel sounds are normal. He exhibits no distension.  Neurological: He is alert.  Makes good eye contact with examiner. Followed basic motor commands. Exam was limited by language barrier. Good sitting balance. Strength generally symmetrical. Senses pain in all 4's. ?language issues, decreased processing and safety awareness. Restless.   Skin: Skin is warm and dry.  Psychiatric:  Confused, restless     No results found for this or any previous visit (from the past 24 hour(s)). No results found.  Assessment/Plan: Diagnosis: embolic left brain infarcts 1. Does the need for close, 24 hr/day medical supervision in concert with the patient's rehab needs make it unreasonable for this patient to be served in a less intensive setting? Yes 2. Co-Morbidities requiring supervision/potential complications: htn 3. Due to bladder management, bowel management, safety, skin/wound care, disease management, medication administration, pain management and patient education, does the patient require 24 hr/day rehab nursing? Yes 4. Does the patient require coordinated care of a physician, rehab nurse, PT (1-2 hrs/day, 5 days/week) and OT (1-2 hrs/day, 5 days/week) to address physical and functional deficits in the context of the above medical diagnosis(es)? Yes Addressing deficits in the following areas: balance, endurance, locomotion, strength, transferring, bowel/bladder control, bathing, dressing, feeding, grooming, toileting and psychosocial support 5. Can the patient actively participate in an intensive therapy program of at least 3 hrs of therapy per day at least 5 days per week? Yes and Potentially 6. The potential for patient to make measurable gains while on inpatient rehab is excellent 7. Anticipated functional outcomes upon discharge from inpatient rehab are supervision to min assist  with PT, supervision to min assist with OT, n/a with SLP. 8. Estimated rehab length of stay to reach the above functional goals is: 13-18 days 9. Does the patient have adequate social supports to accommodate these discharge functional goals? Potentially 10. Anticipated D/C setting: Home 11. Anticipated post D/C treatments: HH therapy 12. Overall Rehab/Functional Prognosis: good  RECOMMENDATIONS: This patient's condition is appropriate for continued rehabilitative care in the following setting: likely CIR (see below) Patient has agreed to participate in recommended program. Potentially Note that insurance prior authorization may be required for reimbursement for recommended care.  Comment: Rehab RN to follow up. Would like to see how he's doing today with PT before we commit to CIR. (appears to have improved neurologically)   Ranelle Oyster, MD, [facility]     06/29/2013

## 2013-06-30 ENCOUNTER — Encounter (HOSPITAL_COMMUNITY)
Admission: EM | Disposition: A | Discharge status: Home Health | Payer: Self-pay | Source: Home / Self Care | Attending: Family Medicine

## 2013-06-30 ENCOUNTER — Encounter (HOSPITAL_COMMUNITY): Payer: Self-pay | Admitting: Gastroenterology

## 2013-06-30 DIAGNOSIS — I6789 Other cerebrovascular disease: Secondary | ICD-10-CM

## 2013-06-30 HISTORY — PX: TEE WITHOUT CARDIOVERSION: SHX5443

## 2013-06-30 SURGERY — LOOP RECORDER IMPLANT
Anesthesia: LOCAL

## 2013-06-30 SURGERY — ECHOCARDIOGRAM, TRANSESOPHAGEAL
Anesthesia: Moderate Sedation

## 2013-06-30 MED ORDER — FENTANYL CITRATE 0.05 MG/ML IJ SOLN
INTRAMUSCULAR | Status: AC
  Filled 2013-06-30: qty 2

## 2013-06-30 MED ORDER — MIDAZOLAM HCL 5 MG/ML IJ SOLN
INTRAMUSCULAR | Status: AC
  Filled 2013-06-30: qty 2

## 2013-06-30 MED ORDER — DIPHENHYDRAMINE HCL 50 MG/ML IJ SOLN
INTRAMUSCULAR | Status: AC
  Filled 2013-06-30: qty 1

## 2013-06-30 MED ORDER — SODIUM CHLORIDE 0.9 % IV SOLN: INTRAVENOUS | Status: DC

## 2013-06-30 MED ORDER — FENTANYL CITRATE 0.05 MG/ML IJ SOLN
INTRAMUSCULAR | Status: DC | PRN
  Administered 2013-06-30: 25 ug via INTRAVENOUS

## 2013-06-30 MED ORDER — TRAZODONE 25 MG HALF TABLET
25.0000 mg | ORAL_TABLET | Freq: Once | ORAL | Status: AC
  Administered 2013-06-30: 25 mg via ORAL
  Filled 2013-06-30: qty 1

## 2013-06-30 MED ORDER — BUTAMBEN-TETRACAINE-BENZOCAINE 2-2-14 % EX AERO
INHALATION_SPRAY | CUTANEOUS | Status: DC | PRN
  Administered 2013-06-30: 2 via TOPICAL

## 2013-06-30 MED ORDER — MIDAZOLAM HCL 10 MG/2ML IJ SOLN
INTRAMUSCULAR | Status: DC | PRN
  Administered 2013-06-30 (×2): 1 mg via INTRAVENOUS
  Administered 2013-06-30: 2 mg via INTRAVENOUS

## 2013-06-30 NOTE — Progress Notes (Signed)
I have seen and examined this patient. I have discussed with Dr Karamalegos.  I agree with their findings and plans as documented in their progress note.      

## 2013-06-30 NOTE — Progress Notes (Signed)
Family Medicine Teaching Service Daily Progress Note Intern Pager: [phone]  Patient name: Tim [name] Medical record number: [id number] Date of birth: 01-23-1941 Age: 72 y.o. Gender: male  Primary Care Provider: No primary provider on file. Consultants: Neurology Code Status: Full  Pt Overview and Major Events to Date:  12/5 - CT / MRI confirmed subacute non-hemorrhagic L-PCA infarct 12/6 - Recommend CIR, ECHO/Carotids (negative). Repeat EKG (normal) 12/9 - TEE today, pending CIR placement (no beds available 12/9, likely 12/10)  Assessment and Plan: Tim [name] is a 72 y.o. [place] male presenting with left sided headache and right sided weakness and numbness found to have a subacute left PCA infarct. PMH is significant for previous ischemic stroke in 2012 with minimal f/up, untreated HTN, HLD.  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 No evidence of residual deficits per exam on admission apart from possible right sided numbness. However did not formally assess gait. Pt also with left sided hearing loss (but per family member this is chronic issue). Risk factors including smoking hx, HTN, HLD, no fmhx per family member. Elevated WBC 11.0 - MRI/MRA brain (Acute/subacute mod size nonhemorrhagic infarct L-temporal, L-occipital, L-thalamus) - ECHO, Carotids normal - frequent neuro checks - inc to ASA 325 daily - risk stratification labs- HgbA1c (5.8), Lipid panel (TC 216, TG 143, HDL 39, LDL 148), TSH (pending) - start Atorvastatin 40mg  daily - PT/OT eval --> recommend CIR (will likely be placed today 2/9 after TEE) - per stroke team recommendations: requested TEE for further eval of embolic source, with loop monitor to follow - contacted Cardiology for scheduled placement of TEE morning 12/9 -  c/s Cardiology - EP has seen pt 12/9 to proceed with TEE today. D/t evidence of non-sustained atrial tachy on tele, expect that will find evidence of AFib in next 30 days, proceed with 30 day monitor  # HTN - Improved Improved BP on Amlodipine. Remains stable 130-140s/80s Denies chest pain but ST depression noted on initial EKG without lead pattern, repeat EKG improved. Troponins (negative x3). CVA considered subacute, therefore outside range of permissive HTN. Suspect pt has long standing untreated HTN which is clearly a risk factor in this case - f/u BP, with goal < 180 / 105 - Amlodipine 10mg  daily  # Encephalopathy, suspected secondary to underlying vascular dementia d/t multiple CVAs Reported confusion by patient's son. Poor orientation, mostly only to person. Minimal orientation to date and place; however, unclear if these are known by pt at baseline, but talking with his sons they believe he is intermittently confused. Suspect due to subacute/chronic vascular dementia with significant h/o multiple CVAs. Additionally, hospital setting can contribute to acutely worsening encephalopathy - continue orienting patient daily  # AKI on CKD vs progression of CKD - 2 years ago Cr 1.6, suspect has CKD, currently improving 1.78--1.76 - tolerating PO so will KVO IVF - avoid nephrotoxic agents  # h/o Gout Treated with allopurinol at home  - will Hold due to elevated Cr  FEN/GI: heart healthy, passed bedside swallow study  Prophylaxis: SQ Heparin  Disposition: PT recommending CIR, pending TEE this morning, plan is tentative acceptance to CIR for continued rehab. Clinically stable at this time. Expect to discharge from primary medical team to admission for CIR when bed available, potential 07/01/13.  Subjective: No acute events overnight. Spoke with patient with  son as Nurse, learning disability. Patient remains essentially unchanged from yesterday. He continues to do well, and is awaiting further testing and  placement. No new concerns.  Objective: Temp:  [97.4 F (36.3 C)-98 F (36.7 C)] 98 F (36.7 C) (12/09 0616) Pulse Rate:  [69-84] 81 (12/09 0616) Resp:  [17-20] 18 (12/09 0616) BP: (129-162)/(74-102) 140/86 mmHg (12/09 0616) SpO2:  [95 %-100 %] 97 % (12/09 0616) Physical Exam: General: sleeping, easily awaken by son, interviewed with assistance of Son as interpreter, NAD Cardiovascular: RRR, II/VI systolic murmur Respiratory: CTAB, normal work of breathing Abdomen: soft, NTND, +active BS Extremities: non-tender, no edema, +2 peripheral pulses Neuro: awake, alert, oriented to person, day, and place, CN-II-XII intact, exam grossly non-focal, unchanged muscle strength from prior exams, mild residual weakness 4/5 RUE, distal sensation to light touch intact w/ resolved R-numbness.  Laboratory:  Recent Labs Lab 06/26/13 1205 06/26/13 1940 06/27/13 0946  WBC 11.0* 10.6* 8.5  HGB 16.7 15.7 14.2  HCT 47.8 45.5 40.8  PLT 337 326 311    Recent Labs Lab 06/26/13 1205 06/26/13 1940 06/27/13 0946 06/29/13 0536  NA 139  --  141 138  K 3.7  --  3.6 3.5  CL 102  --  108 105  CO2 25  --  22 22  BUN 24*  --  26* 29*  CREATININE 2.01* 1.99* 1.78* 1.76*  CALCIUM 9.1  --  8.4 8.7  PROT 7.7  --   --   --   BILITOT 1.0  --   --   --   ALKPHOS 112  --   --   --   ALT 11  --   --   --   AST 15  --   --   --   GLUCOSE 100*  --  88 91   Troponin (negative x 3)  Imaging/Diagnostic Tests:  12/5 CXR IMPRESSION:  1. No acute cardiopulmonary abnormality.  2. 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 Subacute appearing left PCA infarct. Minimal to mild mass effect.  No associated hemorrhage.  2. Underlying advanced chronic small vessel ischemia.  12/5 MRI Brain / MRA Head IMPRESSION:  Some of the sequences are motion degraded.  Acute/ subacute moderate size nonhemorrhagic infarct medial left  temporal lobe, left occipital lobe  and left thalamus.  Remote basal ganglia infarcts. Remote left caudate head infarct.  Remote right coronal radiata infarct. Remote infarct left genu of  the corpus callosum. Remote tiny left thalamic infarct. Small vessel  disease type changes.  Global atrophy without hydrocephalus.  No intracranial hemorrhage.  Non visualization left posterior cerebral artery consistent with  patient's acute infarct.  12/6 EKG NSR, HR 91, non-specific ST-depression  12/6 Repeat EKG NSR, HR 68, no acute findings, resolved ST-depression  12/6 2D ECHO Left ventricle: The cavity size was normal. Wall thickness was normal. Systolic function was normal. The estimated ejection fraction was in the range of 55% to 60%. Wall motion was normal; there were no regional wall motion abnormalities. Doppler parameters are consistent with abnormal left ventricular relaxation (grade 1 diastolic dysfunction). - Aortic valve: Trileaflet; mildly thickened leaflets. Mild regurgitation. - Atrial septum: No defect or patent foramen ovale was Identified.  12/6 Carotid Dopplers Preliminary findings 1-39% ICA stenosis, Negative  12/9 TEE Scheduled for today  Saralyn Pilar, DO 06/30/2013, 6:47 AM PGY-1, [facility] FPTS Intern pager: [phone], text pages welcome

## 2013-06-30 NOTE — H&P (View-Only) (Signed)
  ELECTROPHYSIOLOGY CONSULT NOTE  Patient ID: Tim [name] MRN: [id number], DOB/AGE: 72/06/12/1940   Admit date: 06/26/2013 Date of Consult: 06/30/2013  Primary Physician: No primary provider on file. Primary Cardiologist: new to [facility] Reason for Consultation: Cryptogenic stroke; recommendations regarding Implantable Loop Recorder  History of Present Illness Tim [name] is a 72 year old [place] male with a past medical history significant for hypertension.  He was admitted on 06/26/2013 with left sided headache and right sided weakness.  Imaging demonstrated acute/subacute moderate size non hemorrhagic infarct with multiple remote infarcts.  He has undergone workup for stroke including echocardiogram and carotid dopplers.  The patient has been monitored on telemetry which has demonstrated sinus rhythm with a short run of atrial tach.  Inpatient stroke work-up is to be completed with a TEE.   Echocardiogram this admission demonstrated an EF of 55-60% with no RWMA, grade 1 diastolic dysfunction, LA 30.  Lab work is remarkable for negative cardiac enzymes, creatinine of 2.01 on admission, now 1.76.  Prior to admission, the patient denies chest pain, shortness of breath, dizziness, palpitations, or syncope.  They are recovering from their stroke with evaluation for inpatient rehab at discharge.  EP has been asked to evaluate for placement of an implantable loop recorder to monitor for atrial fibrillation.  ROS- pt unable to provide   Past Medical History  Diagnosis Date  . Hypertension      Surgical History: History reviewed. No pertinent past surgical history.   Prescriptions prior to admission  Medication Sig Dispense Refill  . allopurinol (ZYLOPRIM) 100 MG tablet Take 100 mg by mouth 3 (three) times daily as needed (for pain (gout flare)).      . naproxen (NAPROSYN) 500 MG tablet Take 500 mg by mouth 2 (two) times daily as needed (for pain).        Inpatient Medications:  .  amLODipine  10 mg Oral Daily  . aspirin  325 mg Oral Daily  . atorvastatin  40 mg Oral q1800  . heparin subcutaneous  5,000 Units Subcutaneous Q8H    Allergies: No Known Allergies  History   Social History  . Marital Status: Single    Spouse Name: N/A    Number of Children: N/A  . Years of Education: N/A   Occupational History  . Not on file.   Social History Main Topics  . Smoking status: Current Every Day Smoker  . Smokeless tobacco: Not on file  . Alcohol Use: No  . Drug Use: Not on file  . Sexual Activity: Not on file   Other Topics Concern  . Not on file   Social History Narrative  . No narrative on file     History reviewed. No pertinent family history.   Physical Exam: Filed Vitals:   06/29/13 1810 06/29/13 2149 06/30/13 0207 06/30/13 0616  BP: 148/80 162/96 129/74 140/86  Pulse: 69 84 81 81  Temp: 97.4 F (36.3 C) 97.9 F (36.6 C) 98 F (36.7 C) 98 F (36.7 C)  TempSrc: Oral Oral Oral Oral  Resp: 18 20 18 18  Height:      Weight:      SpO2: 97% 99% 98% 97%    GEN- The patient is sleeping and rouses but has little interest in interacting with MD this am Head- normocephalic, atraumatic Eyes-  Sclera clear, conjunctiva pink Ears- hearing intact Oropharynx- clear Neck- supple  Lungs- Clear to ausculation bilaterally, normal work of breathing Heart- Regular rate and rhythm  GI-   soft, NT, ND, + BS Extremities- no clubbing, cyanosis, or edema   Labs:   Lab Results  Component Value Date   WBC 8.5 06/27/2013   HGB 14.2 06/27/2013   HCT 40.8 06/27/2013   MCV 88.1 06/27/2013   PLT 311 06/27/2013    Recent Labs Lab 06/26/13 1205  06/29/13 0536  NA 139  < > 138  K 3.7  < > 3.5  CL 102  < > 105  CO2 25  < > 22  BUN 24*  < > 29*  CREATININE 2.01*  < > 1.76*  CALCIUM 9.1  < > 8.7  PROT 7.7  --   --   BILITOT 1.0  --   --   ALKPHOS 112  --   --   ALT 11  --   --   AST 15  --   --   GLUCOSE 100*  < > 91  < > = values in this interval not  displayed. Lab Results  Component Value Date   TROPONINI <0.30 06/27/2013   Lab Results  Component Value Date   CHOL 216* 06/27/2013   Lab Results  Component Value Date   HDL 39* 06/27/2013   Lab Results  Component Value Date   LDLCALC 148* 06/27/2013   Lab Results  Component Value Date   TRIG 143 06/27/2013   Lab Results  Component Value Date   CHOLHDL 5.5 06/27/2013    Radiology/Studies: Dg Chest 2 View 06/26/2013   CLINICAL DATA:  72-year-old male with headache dizziness weakness. Initial encounter.  EXAM: CHEST  2 VIEW  COMPARISON:  None.  FINDINGS: Mildly low lung volumes. Cardiac size at the upper limits of normal. Other mediastinal contours are within normal limits. Visualized tracheal air column is within normal limits. No pneumothorax, pulmonary edema, pleural effusion or confluent pulmonary opacity. Small benign appearing chondroid matrix type lesion in the proximal right humerus meta diaphysis. No acute osseous abnormality identified.  IMPRESSION: 1. No acute cardiopulmonary abnormality.  2. 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   Electronically Signed   By: Lee  Hall M.D.   On: 06/26/2013 13:29   Ct Head (brain) Wo Contrast 06/26/2013   CLINICAL DATA:  72-year-old male with weakness numbness on the right side headache and blurred vision. Initial encounter.  EXAM: CT HEAD WITHOUT CONTRAST  TECHNIQUE: Contiguous axial images were obtained from the base of the skull through the vertex without intravenous contrast.  COMPARISON:  None.  FINDINGS: Visualized paranasal sinuses and mastoids are clear. No acute osseous abnormality identified. Visualized orbits and scalp soft tissues are within normal limits.  Cortical and white matter hypodensity in the left PCA territory primarily affecting the left occipital lobe. There is mild associated mass effect, such as on the left occipital horn. No associated hemorrhage is identified.  Elsewhere there are chronic appearing lacunar  infarcts in the bilateral deep gray matter nuclei, and nearby white matter capsules.  No ventriculomegaly. No midline shift. Basilar cisterns are patent. No acute intracranial hemorrhage identified. No suspicious intracranial vascular hyperdensity.  IMPRESSION: 1. Subacute appearing left PCA infarct. Minimal to mild mass effect. No associated hemorrhage.  2.  Underlying advanced chronic small vessel ischemia.   Electronically Signed   By: Lee  Hall M.D.   On: 06/26/2013 11:49   Mr Mra Head Wo Contrast 06/26/2013   CLINICAL DATA:  Left-sided headache with right-sided weakness and numbness.  EXAM: MR BRAIN AND MRA HEAD WITHOUT CONTRAST  TECHNIQUE: Angiographic images of the   Circle of Willis were obtained using MRA technique without intravenous contrast. MR brain without contrast was performed.  COMPARISON:  06/26/2013 head CT.  FINDINGS: MR brain:  Some of the sequences are motion degraded.  Acute/ subacute moderate size nonhemorrhagic infarct medial left temporal lobe, left occipital lobe and left thalamus.  Remote basal ganglia infarcts. Remote left caudate head infarct. Remote right coronal radiata infarct. Remote infarct left genu of the corpus callosum. Remote tiny left thalamic infarct. Small vessel disease type changes.  Global atrophy without hydrocephalus.  No intracranial hemorrhage.  No intracranial mass lesion noted on this unenhanced exam.  Cervical medullary junction, pituitary region, pineal region and orbital structures unremarkable.  MR cervical Willis:  Exam limited by motion. Grading stenosis accurately or evaluating for an aneurysm is limited.  Non visualization left posterior cerebral artery consistent with patient's acute infarct.  Moderate narrowing portions of the right posterior cerebral artery.  Left vertebral artery is dominant.  Mild to moderate narrowing mid aspect of the basilar artery.  Narrowing and irregularity of portions of the superior cerebellar artery bilaterally.  Within the  anterior circulation, flow is noted within the internal carotid arteries and portions of the anterior cerebral arteries and middle cerebral arteries bilaterally. Evaluation otherwise limited.  IMPRESSION: Some of the sequences are motion degraded.  Acute/ subacute moderate size nonhemorrhagic infarct medial left temporal lobe, left occipital lobe and left thalamus.  Remote basal ganglia infarcts. Remote left caudate head infarct. Remote right coronal radiata infarct. Remote infarct left genu of the corpus callosum. Remote tiny left thalamic infarct. Small vessel disease type changes.  Global atrophy without hydrocephalus.  No intracranial hemorrhage.  Non visualization left posterior cerebral artery consistent with patient's acute infarct.   Electronically Signed   By: Steve  Olson M.D.   On: 06/26/2013 21:36   12-lead ECG sinus rhythm rate 68 with normal intervals  Telemetry sinus rhythm with short run of atrial tach  Assessment and Plan:  1. Cryptogenic stroke The patient presents with cryptogenic stroke.  The patient has a TEE planned for this AM.  I think that given nonsustained atrial tachycardia observed on telemetry that the likelihood of finding afib within the next 30 days on event monitor is high.  I would therefore recommend starting with a 30 day event monitor to evaluate for afib.  He will follow-up with Scott Weaver in our office in 6 weeks.  If no afib on event monitor then we will consider implantable loop recorder at that time. 

## 2013-06-30 NOTE — Progress Notes (Signed)
I met with pt, his son, Jill Alexanders, and brother at bedside. No inpt rehab bed is available today and limited beds tomorrow. I discussed SNF vs home with [facility] tomorrow if inpt rehab bed not available. I also discussed with RN CM and resident. I will follow up in the morning. [phone]

## 2013-06-30 NOTE — Interval H&P Note (Signed)
History and Physical Interval Note:  06/30/2013 1:41 PM  Tim [name]  has presented today for surgery, with the diagnosis of STROKE  The various methods of treatment have been discussed with the patient and family. After consideration of risks, benefits and other options for treatment, the patient has consented to  Procedure(s) with comments: TRANSESOPHAGEAL ECHOCARDIOGRAM (TEE) (N/A) - interpreting service set up by [facility] as a surgical intervention .  The patient's history has been reviewed, patient examined, no change in status, stable for surgery.  I have reviewed the patient's chart and labs.  Questions were answered to the patient's satisfaction.     [name] [employer]

## 2013-06-30 NOTE — Progress Notes (Signed)
Physical Therapy Treatment Patient Details Name: Tim [name] MRN: [id number] DOB: 01-Sep-1940 Today's Date: 06/30/2013 Time: 4098-1191 PT Time Calculation (min): 39 min  PT Assessment / Plan / Recommendation  History of Present Illness Tim [name] is an 72 y.o. male with a past medical history significant for HTN, heavy smoking, brought to [facility] ED by family due to new onset HA and right sided weakness.     PT Comments   Pt progressing with mobility today however remains very unsteady with balance and ambulation requiring assistance for safety when OOB. Family and pt made aware that pt should have assistance when OOB for safety. Pt bed alarm went off when beginning documentation; thus writer returned to room to assist with bathroom functional tasks/balance. Pt attempting to drink water in bathroom while he is NPO; RN made aware.  Follow Up Recommendations  CIR     Does the patient have the potential to tolerate intense rehabilitation     Barriers to Discharge        Equipment Recommendations       Recommendations for Other Services Rehab consult  Frequency Min 4X/week   Progress towards PT Goals Progress towards PT goals: Progressing toward goals  Plan Current plan remains appropriate    Precautions / Restrictions Precautions Precautions: Fall Restrictions Weight Bearing Restrictions: No   Pertinent Vitals/Pain no apparent distress     Mobility  Bed Mobility Bed Mobility: Supine to Sit;Sitting - Scoot to Edge of Bed;Sit to Supine Supine to Sit: 5: Supervision Sitting - Scoot to Edge of Bed: 5: Supervision Sit to Supine: 5: Supervision Transfers Transfers: Sit to Stand;Stand to Sit Sit to Stand: 4: Min guard;With upper extremity assist;From bed;From toilet Stand to Sit: 4: Min guard;With upper extremity assist;To bed;To toilet Details for Transfer Assistance: pt unsteady and reaching for objects for balance when come to standing Ambulation/Gait Ambulation/Gait Assistance: 4: Min  assist;3: Mod assist Ambulation Distance (Feet): 150 Feet Assistive device: 1 person hand held assist Ambulation/Gait Assistance Details: pt remains ataxic with gait with multiple LOB to the right requiring assist to regain balance; pt reaching for objects when nearby Gait Pattern: Step-through pattern;Decreased stride length;Right steppage;Scissoring;Ataxic;Narrow base of support Gait velocity: decreased General Gait Details: Very unsteady gait    Exercises     PT Diagnosis:    PT Problem List:   PT Treatment Interventions:     PT Goals (current goals can now be found in the care plan section)    Visit Information  Last PT Received On: 06/30/13 Assistance Needed: +1 History of Present Illness: Tim [name] is an 72 y.o. male with a past medical history significant for HTN, heavy smoking, brought to [name] ED by family due to new onset HA and right sided weakness.      Subjective Data      Cognition  Cognition Arousal/Alertness: Awake/alert Behavior During Therapy: WFL for tasks assessed/performed Overall Cognitive Status: Impaired/Different from baseline (Son stated that he was more confused) Difficult to assess due to: Impaired communication;Non-English speaking    Balance  Static Sitting Balance Static Sitting - Balance Support: Feet supported Static Sitting - Level of Assistance: 5: Stand by assistance Static Sitting - Comment/# of Minutes: stand by assist for safety with functional activity Static Standing Balance Static Standing - Balance Support: During functional activity;Left upper extremity supported;No upper extremity supported Static Standing - Level of Assistance: 5: Stand by assistance  End of Session PT - End of Session Equipment Utilized During Treatment: Gait belt Activity Tolerance: Patient  tolerated treatment well Patient left: in bed;with call bell/phone within reach;with family/visitor present Nurse Communication: Mobility status   GP     Ernestina Columbia,  SPTA 06/30/2013, 9:02 AM

## 2013-06-30 NOTE — Progress Notes (Signed)
Echocardiogram Echocardiogram Transesophageal has been performed.  Tim [name] 06/30/2013, 1:24 PM

## 2013-06-30 NOTE — Consult Note (Signed)
ELECTROPHYSIOLOGY CONSULT NOTE  Patient IDDomonick [name] MRN: [id number], DOB/AGE: 1940-09-18   Admit date: 06/26/2013 Date of Consult: 06/30/2013  Primary Physician: No primary provider on file. Primary Cardiologist: new to [facility] Reason for Consultation: Cryptogenic stroke; recommendations regarding Implantable Loop Recorder  History of Present Illness Tim [name] is a 72 year old [place] male with a past medical history significant for hypertension.  He was admitted on 06/26/2013 with left sided headache and right sided weakness.  Imaging demonstrated acute/subacute moderate size non hemorrhagic infarct with multiple remote infarcts.  He has undergone workup for stroke including echocardiogram and carotid dopplers.  The patient has been monitored on telemetry which has demonstrated sinus rhythm with a short run of atrial tach.  Inpatient stroke work-up is to be completed with a TEE.   Echocardiogram this admission demonstrated an EF of 55-60% with no RWMA, grade 1 diastolic dysfunction, LA 30.  Lab work is remarkable for negative cardiac enzymes, creatinine of 2.01 on admission, now 1.76.  Prior to admission, the patient denies chest pain, shortness of breath, dizziness, palpitations, or syncope.  They are recovering from their stroke with evaluation for inpatient rehab at discharge.  EP has been asked to evaluate for placement of an implantable loop recorder to monitor for atrial fibrillation.  ROS- pt unable to provide   Past Medical History  Diagnosis Date  . Hypertension      Surgical History: History reviewed. No pertinent past surgical history.   Prescriptions prior to admission  Medication Sig Dispense Refill  . allopurinol (ZYLOPRIM) 100 MG tablet Take 100 mg by mouth 3 (three) times daily as needed (for pain (gout flare)).      . naproxen (NAPROSYN) 500 MG tablet Take 500 mg by mouth 2 (two) times daily as needed (for pain).        Inpatient Medications:  .  amLODipine  10 mg Oral Daily  . aspirin  325 mg Oral Daily  . atorvastatin  40 mg Oral q1800  . heparin subcutaneous  5,000 Units Subcutaneous Q8H    Allergies: No Known Allergies  History   Social History  . Marital Status: Single    Spouse Name: N/A    Number of Children: N/A  . Years of Education: N/A   Occupational History  . Not on file.   Social History Main Topics  . Smoking status: Current Every Day Smoker  . Smokeless tobacco: Not on file  . Alcohol Use: No  . Drug Use: Not on file  . Sexual Activity: Not on file   Other Topics Concern  . Not on file   Social History Narrative  . No narrative on file     History reviewed. No pertinent family history.   Physical Exam: Filed Vitals:   06/29/13 1810 06/29/13 2149 06/30/13 0207 06/30/13 0616  BP: 148/80 162/96 129/74 140/86  Pulse: 69 84 81 81  Temp: 97.4 F (36.3 C) 97.9 F (36.6 C) 98 F (36.7 C) 98 F (36.7 C)  TempSrc: Oral Oral Oral Oral  Resp: 18 20 18 18   Height:      Weight:      SpO2: 97% 99% 98% 97%    GEN- The patient is sleeping and rouses but has little interest in interacting with MD this am Head- normocephalic, atraumatic Eyes-  Sclera clear, conjunctiva pink Ears- hearing intact Oropharynx- clear Neck- supple  Lungs- Clear to ausculation bilaterally, normal work of breathing Heart- Regular rate and rhythm  GI-  soft, NT, ND, + BS Extremities- no clubbing, cyanosis, or edema   Labs:   Lab Results  Component Value Date   WBC 8.5 06/27/2013   HGB 14.2 06/27/2013   HCT 40.8 06/27/2013   MCV 88.1 06/27/2013   PLT 311 06/27/2013    Recent Labs Lab 06/26/13 1205  06/29/13 0536  NA 139  < > 138  K 3.7  < > 3.5  CL 102  < > 105  CO2 25  < > 22  BUN 24*  < > 29*  CREATININE 2.01*  < > 1.76*  CALCIUM 9.1  < > 8.7  PROT 7.7  --   --   BILITOT 1.0  --   --   ALKPHOS 112  --   --   ALT 11  --   --   AST 15  --   --   GLUCOSE 100*  < > 91  < > = values in this interval not  displayed. Lab Results  Component Value Date   TROPONINI <0.30 06/27/2013   Lab Results  Component Value Date   CHOL 216* 06/27/2013   Lab Results  Component Value Date   HDL 39* 06/27/2013   Lab Results  Component Value Date   LDLCALC 148* 06/27/2013   Lab Results  Component Value Date   TRIG 143 06/27/2013   Lab Results  Component Value Date   CHOLHDL 5.5 06/27/2013    Radiology/Studies: Dg Chest 2 View 06/26/2013   CLINICAL DATA:  72 year old male with headache dizziness weakness. Initial encounter.  EXAM: CHEST  2 VIEW  COMPARISON:  None.  FINDINGS: Mildly low lung volumes. Cardiac size at the upper limits of normal. Other mediastinal contours are within normal limits. Visualized tracheal air column is within normal limits. No pneumothorax, pulmonary edema, pleural effusion or confluent pulmonary opacity. Small benign appearing chondroid matrix type lesion in the proximal right humerus meta diaphysis. No acute osseous abnormality identified.  IMPRESSION: 1. No acute cardiopulmonary abnormality.  2. 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   Electronically Signed   By: Augusto Gamble M.D.   On: 06/26/2013 13:29   Ct Head (brain) Wo Contrast 06/26/2013   CLINICAL DATA:  72 year old male with weakness numbness on the right side headache and blurred vision. Initial encounter.  EXAM: CT HEAD WITHOUT CONTRAST  TECHNIQUE: Contiguous axial images were obtained from the base of the skull through the vertex without intravenous contrast.  COMPARISON:  None.  FINDINGS: Visualized paranasal sinuses and mastoids are clear. No acute osseous abnormality identified. Visualized orbits and scalp soft tissues are within normal limits.  Cortical and white matter hypodensity in the left PCA territory primarily affecting the left occipital lobe. There is mild associated mass effect, such as on the left occipital horn. No associated hemorrhage is identified.  Elsewhere there are chronic appearing lacunar  infarcts in the bilateral deep gray matter nuclei, and nearby white matter capsules.  No ventriculomegaly. No midline shift. Basilar cisterns are patent. No acute intracranial hemorrhage identified. No suspicious intracranial vascular hyperdensity.  IMPRESSION: 1. Subacute appearing left PCA infarct. Minimal to mild mass effect. No associated hemorrhage.  2.  Underlying advanced chronic small vessel ischemia.   Electronically Signed   By: Augusto Gamble M.D.   On: 06/26/2013 11:49   Mr Maxine Glenn Head Wo Contrast 06/26/2013   CLINICAL DATA:  Left-sided headache with right-sided weakness and numbness.  EXAM: MR BRAIN AND MRA HEAD WITHOUT CONTRAST  TECHNIQUE: Angiographic images of the  Circle of Willis were obtained using MRA technique without intravenous contrast. MR brain without contrast was performed.  COMPARISON:  06/26/2013 head CT.  FINDINGS: MR brain:  Some of the sequences are motion degraded.  Acute/ subacute moderate size nonhemorrhagic infarct medial left temporal lobe, left occipital lobe and left thalamus.  Remote basal ganglia infarcts. Remote left caudate head infarct. Remote right coronal radiata infarct. Remote infarct left genu of the corpus callosum. Remote tiny left thalamic infarct. Small vessel disease type changes.  Global atrophy without hydrocephalus.  No intracranial hemorrhage.  No intracranial mass lesion noted on this unenhanced exam.  Cervical medullary junction, pituitary region, pineal region and orbital structures unremarkable.  MR cervical Willis:  Exam limited by motion. Grading stenosis accurately or evaluating for an aneurysm is limited.  Non visualization left posterior cerebral artery consistent with patient's acute infarct.  Moderate narrowing portions of the right posterior cerebral artery.  Left vertebral artery is dominant.  Mild to moderate narrowing mid aspect of the basilar artery.  Narrowing and irregularity of portions of the superior cerebellar artery bilaterally.  Within the  anterior circulation, flow is noted within the internal carotid arteries and portions of the anterior cerebral arteries and middle cerebral arteries bilaterally. Evaluation otherwise limited.  IMPRESSION: Some of the sequences are motion degraded.  Acute/ subacute moderate size nonhemorrhagic infarct medial left temporal lobe, left occipital lobe and left thalamus.  Remote basal ganglia infarcts. Remote left caudate head infarct. Remote right coronal radiata infarct. Remote infarct left genu of the corpus callosum. Remote tiny left thalamic infarct. Small vessel disease type changes.  Global atrophy without hydrocephalus.  No intracranial hemorrhage.  Non visualization left posterior cerebral artery consistent with patient's acute infarct.   Electronically Signed   By: Bridgett Larsson M.D.   On: 06/26/2013 21:36   12-lead ECG sinus rhythm rate 68 with normal intervals  Telemetry sinus rhythm with short run of atrial tach  Assessment and Plan:  1. Cryptogenic stroke The patient presents with cryptogenic stroke.  The patient has a TEE planned for this AM.  I think that given nonsustained atrial tachycardia observed on telemetry that the likelihood of finding afib within the next 30 days on event monitor is high.  I would therefore recommend starting with a 30 day event monitor to evaluate for afib.  He will follow-up with Tereso Newcomer in our office in 6 weeks.  If no afib on event monitor then we will consider implantable loop recorder at that time.

## 2013-06-30 NOTE — CV Procedure (Signed)
Procedure: TEE  Indication: CVA  Sedation: Versed 4 mg IV, Fentanyl 25 mcg IV  Findings: Please see echo section of chart for full report.  Normal LV size and systolic function, EF 55-60%.  Normal RV size and systolic function.  No significant valvular abnormalities.  No LA appendage thrombus.  No PFO or ASD.  There was grade IV plaque in the descending thoracic aorta but only mild plaque noted in the aortic arch.   No source of embolus.   Tim [name] 06/30/2013 1:54 PM

## 2013-06-30 NOTE — Progress Notes (Signed)
Continue to recommend CIR for patient for functional independence prior to return home with family 06/30/2013 Fredrich Birks PTA [phone] pager [phone] office

## 2013-06-30 NOTE — Progress Notes (Signed)
Stroke Team Progress Note  HISTORY Tim [name] is a 72 y.o. male with a past medical history significant for HTN, heavy smoking, brought to [facility] ED D. 06/26/2013 by family due to new onset HA and right sided weakness. Patient doesn't speak English and all information is provided by family who expressed that Tim [name] started complaining of HA 2 days ago and then became " paralyzed in the right side" to the degree that walking has been a major struggle and he had a fall. No reported vertigo, double vision, slurred speech, language or vision impairment. 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  Patient was not a TPA candidate secondary to outside of TPA window.   SUBJECTIVE Patient sleeping. Plan TEE later today. D/w son  OBJECTIVE Most recent Vital Signs: Filed Vitals:   06/29/13 2149 06/30/13 0207 06/30/13 0616 06/30/13 0917  BP: 162/96 129/74 140/86 133/81  Pulse: 84 81 81 64  Temp: 97.9 F (36.6 C) 98 F (36.7 C) 98 F (36.7 C) 97.5 F (36.4 C)  TempSrc: Oral Oral Oral Oral  Resp: 20 18 18 16   Height:      Weight:      SpO2: 99% 98% 97% 98%   CBG (last 3)  No results found for this basename: GLUCAP,  in the last 72 hours  IV Fluid Intake:   . sodium chloride 75 mL/hr at 06/26/13 1347    MEDICATIONS  . amLODipine  10 mg Oral Daily  . aspirin  325 mg Oral Daily  . atorvastatin  40 mg Oral q1800  . heparin subcutaneous  5,000 Units Subcutaneous Q8H   PRN:  acetaminophen, senna-docusate  Diet:  NPO  Activity:   Up with assistance DVT Prophylaxis:  Scds, ambulating - subcutaneous heparin  CLINICALLY SIGNIFICANT STUDIES Basic Metabolic Panel:   Recent Labs Lab 06/27/13 0946 06/29/13 0536  NA 141 138  K 3.6 3.5  CL 108 105  CO2 22 22  GLUCOSE 88 91  BUN 26* 29*  CREATININE 1.78* 1.76*  CALCIUM 8.4 8.7   Liver Function Tests:   Recent Labs Lab 06/26/13 1205  AST 15  ALT 11  ALKPHOS 112  BILITOT 1.0  PROT 7.7  ALBUMIN 3.5   CBC:   Recent  Labs Lab 06/26/13 1205 06/26/13 1940 06/27/13 0946  WBC 11.0* 10.6* 8.5  NEUTROABS 8.2*  --   --   HGB 16.7 15.7 14.2  HCT 47.8 45.5 40.8  MCV 86.8 88.3 88.1  PLT 337 326 311   Coagulation: No results found for this basename: LABPROT, INR,  in the last 168 hours Cardiac Enzymes:   Recent Labs Lab 06/26/13 1940 06/27/13 0143 06/27/13 0810  TROPONINI <0.30 <0.30 <0.30   Urinalysis:   Recent Labs Lab 06/26/13 1323  COLORURINE YELLOW  LABSPEC 1.024  PHURINE 6.0  GLUCOSEU NEGATIVE  HGBUR SMALL*  BILIRUBINUR NEGATIVE  KETONESUR NEGATIVE  PROTEINUR >300*  UROBILINOGEN 0.2  NITRITE NEGATIVE  LEUKOCYTESUR NEGATIVE   Lipid Panel    Component Value Date/Time   CHOL 216* 06/27/2013 0143   TRIG 143 06/27/2013 0143   HDL 39* 06/27/2013 0143   CHOLHDL 5.5 06/27/2013 0143   VLDL 29 06/27/2013 0143   LDLCALC 148* 06/27/2013 0143   HgbA1C  Lab Results  Component Value Date   HGBA1C 5.8* 06/26/2013    Urine Drug Screen:   No results found for this basename: labopia,  cocainscrnur,  labbenz,  amphetmu,  thcu,  labbarb    Alcohol Level:  No results found for this basename: ETH,  in the last 168 hours  Dg Chest 2 View  06/26/2013    1. No acute cardiopulmonary abnormality.  2. 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     Ct Head (brain) Wo Contrast 06/26/2013   1. Subacute appearing left PCA infarct. Minimal to mild mass effect. No associated hemorrhage.  2.  Underlying advanced chronic small vessel ischemia.    Mri / Mra Head Wo Contrast 06/26/2013    Some of the sequences are motion degraded.  Acute/ subacute moderate size nonhemorrhagic infarct medial left temporal lobe, left occipital lobe and left thalamus.  Remote basal ganglia infarcts. Remote left caudate head infarct. Remote right coronal radiata infarct. Remote infarct left genu of the corpus callosum. Remote tiny left thalamic infarct. Small vessel disease type changes.  Global atrophy without hydrocephalus.  No  intracranial hemorrhage.  Non visualization left posterior cerebral artery consistent with patient's acute infarct.    2D Echocardiogram  EF 55-60% with no source of embolus.   Carotid Doppler Bilateral: 1-39% ICA stenosis. Vertebral artery flow is antegrade  EKG  sinus rhythm rate 68 beats per minute  Therapy Recommendations inpatient rehabilitation.  Physical Exam  Mental Status:  Alert, awake, oriented, thought content appropriate. Speech fluent without evidence of aphasia. Able to follow 3 step commands without difficulty.  Cranial Nerves:  II: Discs flat bilaterally; Visual fields grossly normal, pupils equal, round, reactive to light and accommodation  III,IV, VI: ptosis not present, extra-ocular motions intact bilaterally  V,VII: smile symmetric, facial light touch sensation normal bilaterally  VIII: hearing normal bilaterally  IX,X: gag reflex present  XI: bilateral shoulder shrug  XII: midline tongue extension without atrophy or fasciculations  Motor:  Mild right HP  Tone and bulk:normal tone throughout; no atrophy noted  Sensory: Pinprick and light touch intact throughout impaired in the right side.  Deep Tendon Reflexes:  Right: Upper Extremity Left: Upper extremity  biceps (C-5 to C-6) 2/4 biceps (C-5 to C-6) 2/4  tricep (C7) 2/4 triceps (C7) 2/4  Brachioradialis (C6) 2/4 Brachioradialis (C6) 2/4  Lower Extremity Lower Extremity  quadriceps (L-2 to L-4) 2/4 quadriceps (L-2 to L-4) 2/4  Achilles (S1) 2/4 Achilles (S1) 2/4  Plantars:  Right: downgoing Left: downgoing  Cerebellar:  normal finger-to-nose, normal heel-to-shin test  Gait:  No tested.  CV: pulses palpable throughout    ASSESSMENT 72 y/o with recent onset HA and mild right hemiparesis. MRI - acute/ subacute moderate size nonhemorrhagic infarct medial left temporal lobe, left occipital lobe and left thalamus with multiple remote infarcts. Infarcts felt to be embolic of uncertain etiology. The patient was  on no antibiotic prior to admission. He is now on aspirin 325 mg daily. He continues to have mild right hemiparesis. IP rehab recommended.   Hypertension  Renal insufficiency - improving  Hyperlipidemia - cholesterol 216 LDL 148 - now on Lipitor  Tobacco history  Hospital day # 4  TREATMENT/PLAN  Continue full dose ASA of 325 mg daily for secondary stroke prevention  Rehabilitation after TEE, when bed available TEE to look for embolic source. . If positive for PFO (patent foramen ovale), check bilateral lower extremity venous dopplers to rule out DVT as possible source of stroke.   Per Dr. Johney Frame:  given non-sustained atrial tachycardia this am, likelihood of finding atrial fibrillation is high within next 30 days, therefore, a 30-day event monitor is recommended. Will follow up  In 6 weeks. If negative at that time, will  consider loop placement.  Annie Main, MSN, RN, ANVP-BC, ANP-BC, [facility] Pager: [phone] 06/30/2013 10:21 AM  I have personally obtained a history, examined the patient, evaluated imaging results, and formulated the assessment and plan of care. I agree with the above.  Delia Heady, MD

## 2013-07-01 ENCOUNTER — Encounter (HOSPITAL_COMMUNITY): Payer: Self-pay | Admitting: Cardiology

## 2013-07-01 MED ORDER — ATORVASTATIN CALCIUM 40 MG PO TABS: 40.0000 mg | ORAL_TABLET | Freq: Every day | ORAL | Status: AC

## 2013-07-01 MED ORDER — ASPIRIN 325 MG PO TABS: 325.0000 mg | ORAL_TABLET | Freq: Every day | ORAL | Status: AC

## 2013-07-01 MED ORDER — AMLODIPINE BESYLATE 10 MG PO TABS: 10.0000 mg | ORAL_TABLET | Freq: Every day | ORAL | Status: AC

## 2013-07-01 NOTE — Progress Notes (Signed)
CSW received referral for questionable SNF.   Chart reviewed. Will advise RN Case Manager for assessment of home health and DME needs.   CSW will sign off. Please re-consult is CSW needs arise.    Tim [name] [facility]  [address];  [phone] Phone: [phone]

## 2013-07-01 NOTE — Care Management Note (Signed)
    Page 1 of 2   07/01/2013     2:55:10 PM   CARE MANAGEMENT NOTE 07/01/2013  Patient:  [facility]   Account Number:  [id number]  Date Initiated:  07/01/2013  Documentation initiated by:  Elmer Bales  Subjective/Objective Assessment:   Patient admitted for CVA. Lives at home with family     Action/Plan:   Will follow for discharge needs   Anticipated DC Date:  07/01/2013   Anticipated DC Plan:  SKILLED NURSING FACILITY  In-house referral  Clinical Social Worker      DC Planning Services  CM consult      Choice offered to / List presented to:  C-4 Adult Children        HH arranged  HH-1 RN  HH-2 PT  HH-3 OT  HH-10 DISEASE MANAGEMENT  HH-6 SOCIAL WORKER      HH agency  [facility]   Status of service:  Completed, signed off Medicare Important Message given?   (If response is "NO", the following Medicare IM given date fields will be blank) Date Medicare IM given:   Date Additional Medicare IM given:    Discharge Disposition:  HOME W HOME HEALTH SERVICES  Per UR Regulation:  Reviewed for med. necessity/level of care/duration of stay  If discussed at Long Length of Stay Meetings, dates discussed:    Comments:  07/01/13 1415 Elmer Bales RN, MSN, CM- Met with patient and family to discuss home health needs.  Pt's family has chosen [facility].  Mary with [facility] was notified and accepted the referral.  Patient's primary contact is son Selena Batten [phone].  Second contact is patient's daughter-in-law Glynis Smiles [phone].

## 2013-07-01 NOTE — Progress Notes (Signed)
Stroke Team Progress Note  HISTORY Tim [name] is a 72 y.o. male with a past medical history significant for HTN, heavy smoking, brought to [facility] ED D. 06/26/2013 by family due to new onset HA and right sided weakness. Patient doesn't speak English and all information is provided by family who expressed that Tim [name] started complaining of HA 2 days ago and then became " paralyzed in the right side" to the degree that walking has been a major struggle and he had a fall. No reported vertigo, double vision, slurred speech, language or vision impairment. 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  Patient was not a TPA candidate secondary to outside of TPA window.   SUBJECTIVE Son at bedside.  OBJECTIVE Most recent Vital Signs: Filed Vitals:   06/30/13 1744 06/30/13 2125 07/01/13 0152 07/01/13 0543  BP: 144/90 118/70 128/74 145/71  Pulse: 77 61 70 70  Temp: 97.6 F (36.4 C) 97.3 F (36.3 C) 97.6 F (36.4 C) 97.4 F (36.3 C)  TempSrc: Oral Oral Oral Oral  Resp: 16 16 16 16   Height:      Weight:      SpO2: 95% 99% 97% 92%   CBG (last 3)  No results found for this basename: GLUCAP,  in the last 72 hours  IV Fluid Intake:   . sodium chloride 500 mL (06/30/13 1232)  . sodium chloride      MEDICATIONS  . amLODipine  10 mg Oral Daily  . aspirin  325 mg Oral Daily  . atorvastatin  40 mg Oral q1800  . heparin subcutaneous  5,000 Units Subcutaneous Q8H   PRN:  acetaminophen, senna-docusate  Diet:  Cardiac  Activity:   Up with assistance DVT Prophylaxis:  Scds, ambulating - subcutaneous heparin  CLINICALLY SIGNIFICANT STUDIES Basic Metabolic Panel:   Recent Labs Lab 06/27/13 0946 06/29/13 0536  NA 141 138  K 3.6 3.5  CL 108 105  CO2 22 22  GLUCOSE 88 91  BUN 26* 29*  CREATININE 1.78* 1.76*  CALCIUM 8.4 8.7   Liver Function Tests:   Recent Labs Lab 06/26/13 1205  AST 15  ALT 11  ALKPHOS 112  BILITOT 1.0  PROT 7.7  ALBUMIN 3.5   CBC:   Recent  Labs Lab 06/26/13 1205 06/26/13 1940 06/27/13 0946  WBC 11.0* 10.6* 8.5  NEUTROABS 8.2*  --   --   HGB 16.7 15.7 14.2  HCT 47.8 45.5 40.8  MCV 86.8 88.3 88.1  PLT 337 326 311   Coagulation: No results found for this basename: LABPROT, INR,  in the last 168 hours Cardiac Enzymes:   Recent Labs Lab 06/26/13 1940 06/27/13 0143 06/27/13 0810  TROPONINI <0.30 <0.30 <0.30   Urinalysis:   Recent Labs Lab 06/26/13 1323  COLORURINE YELLOW  LABSPEC 1.024  PHURINE 6.0  GLUCOSEU NEGATIVE  HGBUR SMALL*  BILIRUBINUR NEGATIVE  KETONESUR NEGATIVE  PROTEINUR >300*  UROBILINOGEN 0.2  NITRITE NEGATIVE  LEUKOCYTESUR NEGATIVE   Lipid Panel    Component Value Date/Time   CHOL 216* 06/27/2013 0143   TRIG 143 06/27/2013 0143   HDL 39* 06/27/2013 0143   CHOLHDL 5.5 06/27/2013 0143   VLDL 29 06/27/2013 0143   LDLCALC 148* 06/27/2013 0143   HgbA1C  Lab Results  Component Value Date   HGBA1C 5.8* 06/26/2013    Urine Drug Screen:   No results found for this basename: labopia,  cocainscrnur,  labbenz,  amphetmu,  thcu,  labbarb    Alcohol Level:  No results found for this basename: ETH,  in the last 168 hours  Dg Chest 2 View  06/26/2013    1. No acute cardiopulmonary abnormality.  2. 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     Ct Head (brain) Wo Contrast 06/26/2013   1. Subacute appearing left PCA infarct. Minimal to mild mass effect. No associated hemorrhage.  2.  Underlying advanced chronic small vessel ischemia.    Mri / Mra Head Wo Contrast 06/26/2013    Some of the sequences are motion degraded.  Acute/ subacute moderate size nonhemorrhagic infarct medial left temporal lobe, left occipital lobe and left thalamus.  Remote basal ganglia infarcts. Remote left caudate head infarct. Remote right coronal radiata infarct. Remote infarct left genu of the corpus callosum. Remote tiny left thalamic infarct. Small vessel disease type changes.  Global atrophy without hydrocephalus.  No  intracranial hemorrhage.  Non visualization left posterior cerebral artery consistent with patient's acute infarct.    2D Echocardiogram  EF 55-60% with no source of embolus.   Carotid Doppler Bilateral: 1-39% ICA stenosis. Vertebral artery flow is antegrade  TEE  Normal LV size and systolic function, EF 55-60%. Normal RV size and systolic function. No significant valvular abnormalities. No LA appendage thrombus. No PFO or ASD. There was grade IV plaque in the descending thoracic aorta but only mild plaque noted in the aortic arch.   EKG  sinus rhythm rate 68 beats per minute  Therapy Recommendations inpatient rehabilitation.  Physical Exam  Mental Status:  Alert, awake, oriented, thought content appropriate. Speech fluent without evidence of aphasia. Able to follow 3 step commands without difficulty.  Cranial Nerves:  II: Discs flat bilaterally; Visual fields grossly normal, pupils equal, round, reactive to light and accommodation  III,IV, VI: ptosis not present, extra-ocular motions intact bilaterally  V,VII: smile symmetric, facial light touch sensation normal bilaterally  VIII: hearing normal bilaterally  IX,X: gag reflex present  XI: bilateral shoulder shrug  XII: midline tongue extension without atrophy or fasciculations  Motor:  Mild right HP  Tone and bulk:normal tone throughout; no atrophy noted  Sensory: Pinprick and light touch intact throughout impaired in the right side.  Deep Tendon Reflexes:  Right: Upper Extremity Left: Upper extremity  biceps (C-5 to C-6) 2/4 biceps (C-5 to C-6) 2/4  tricep (C7) 2/4 triceps (C7) 2/4  Brachioradialis (C6) 2/4 Brachioradialis (C6) 2/4  Lower Extremity Lower Extremity  quadriceps (L-2 to L-4) 2/4 quadriceps (L-2 to L-4) 2/4  Achilles (S1) 2/4 Achilles (S1) 2/4  Plantars:  Right: downgoing Left: downgoing  Cerebellar:  normal finger-to-nose, normal heel-to-shin test  Gait:  No tested.  CV: pulses palpable throughout     ASSESSMENT 72 y/o with recent onset HA and mild right hemiparesis. MRI - acute/ subacute moderate size nonhemorrhagic infarct medial left temporal lobe, left occipital lobe and left thalamus with multiple remote infarcts. Infarcts felt to be embolic of uncertain etiology. The patient was on no antibiotic prior to admission. He is now on aspirin 325 mg daily. He continues to have mild right hemiparesis. IP rehab recommended.   Hypertension  Renal insufficiency - improving  Hyperlipidemia - cholesterol 216 LDL 148 - now on Lipitor  Tobacco history  Baseline dementia  Per Dr. Johney Frame:  given non-sustained atrial tachycardia this am, likelihood of finding atrial fibrillation is high within next 30 days, therefore, a 30-day event monitor is recommended.   Hospital day # 5  TREATMENT/PLAN  Continue full dose ASA of 325 mg daily for  secondary stroke prevention  Rehab bed at [facility] not available, SW and CM involved to determine alternatives - family agreeable to discharge home with [facility] follow up  Arrange 30-day event monitor arranged with [facility].   Follow up [facility] In 6 weeks. If monitor negative at that time, will consider loop placement. No further stroke workup indicated. Patient has a 10-15% risk of having another stroke over the next year, the highest risk is within 2 weeks of the most recent stroke/TIA (risk of having a stroke following a stroke or TIA is the same). Ongoing risk factor control by Primary Care Physician Stroke Service will sign off. Please call should any needs arise. Follow up with Dr. Pearlean Brownie, Stroke Clinic, in 2 months.  Dr. Pearlean Brownie discussed with medical team.  Annie Main, MSN, RN, ANVP-BC, ANP-BC, GNP-BC [facility] Pager: [phone] 07/01/2013 9:58 AM  I have personally obtained a history, examined the patient, evaluated imaging results, and formulated the assessment and plan of care. I agree with the above. Delia Heady, MD

## 2013-07-01 NOTE — Progress Notes (Signed)
OT NOTE  CIR does not have bed available but patient remains strong CIR candidate for other programs in our area. Please provide family with local options of additional CIR programs in addition to the SNF bed offers. Pt could benefit from the intense program and having interpreters available in a CIR program.   Thank you in advance    Mateo Flow   OTR/L Pager: [phone] Office: [phone] .

## 2013-07-01 NOTE — Progress Notes (Signed)
Pt. DC'd home via car with family.  DC instructions given to son and understood.  Assessments were stable.

## 2013-07-01 NOTE — Progress Notes (Signed)
CSW received referral for questionable SNF.   Chart reviewed, spoke with Pt who indicates plan is to d/c home. Will advise RN Case Manager for assessment of home health and DME needs.   CSW will sign off. Please re-consult is CSW needs arise.    Semiyah Newgent LCSWA  [facility]  4N 1-16;  6N1-16 Phone: [phone]      

## 2013-07-01 NOTE — Progress Notes (Signed)
Inpt rehab bed is not available today for this pt. [phone]

## 2013-07-01 NOTE — Progress Notes (Signed)
Pt heart rhythm changed from Sinus Rhythm to Accelerated idioventricular rhythm for 1 minute then came back to Sinus rhythm. Pt asymptomatic. Vital signs stable. MD on-call aware with no new orders. Will continue to monitor pt.

## 2013-07-01 NOTE — Progress Notes (Signed)
Occupational Therapy Treatment Patient Details Name: Tim [name] MRN: [id number] DOB: Dec 15, 1940 Today's Date: 07/01/2013 Time: 1478-2956 OT Time Calculation (min): 11 min  OT Assessment / Plan / Recommendation  History of present illness Tim [name] is an 72 y.o. male with a past medical history significant for HTN, heavy smoking, brought to [facility] ED by family due to new onset HA and right sided weakness.     OT comments  Pt will require 24/7 (A) due to balance and cognition deficits. Pt should not be allowed to cook or drive at this time due to risk for injury. Pt very unsteady and dragging Rt LE during session. Pt with sustain attention and repetitive in telling son "my leg is weak".  Follow Up Recommendations  Home health OT;Supervision/Assistance - 24 hour (due to CIR denial due to bed availability)    Barriers to Discharge       Equipment Recommendations  None recommended by OT    Recommendations for Other Services Rehab consult  Frequency Min 3X/week   Progress towards OT Goals Progress towards OT goals: Progressing toward goals  Plan Discharge plan remains appropriate    Precautions / Restrictions Precautions Precautions: Fall   Pertinent Vitals/Pain Fall risk None reported    ADL  ADL Comments: Pt restless in bed on arrival. Son asked if any questions or concerns for d/c planning. Pt agreeable to mobility. pt asked with son translating "do you like the decorations?" pt did not recognize decor as Christmas but rather as a birthday party. Pt asking "whose party?" pt educated that is it is time for "christmas" pt with a very shocked expressed. Pt c/o not being able to hear out of left ear and right LE weakness. pt dragging Rt LE during session and return to room due to RT LE fatigue. pt declined bath and dressing at this time. Pt's son educated that Engineer, manufacturing will assist with bathign and dressing prior to d/c . Son reports at baseline deficits with hygiene at home.    OT Diagnosis:     OT Problem List:   OT Treatment Interventions:     OT Goals(current goals can now be found in the care plan section) Acute Rehab OT Goals Patient Stated Goal: to go home, to smoke per son translation OT Goal Formulation: With patient/family Time For Goal Achievement: 07/13/13 Potential to Achieve Goals: Good ADL Goals Pt Will Perform Grooming: with min assist;standing Pt Will Perform Upper Body Bathing: with min assist;standing Pt Will Perform Lower Body Bathing: with min assist;sit to/from stand Pt Will Transfer to Toilet: with supervision;regular height toilet Additional ADL Goal #1: Pt will complete two step command with task setup without additional cues   Visit Information  Last OT Received On: 07/01/13 Assistance Needed: +1 History of Present Illness: Tim [name] is an 72 y.o. male with a past medical history significant for HTN, heavy smoking, brought to [facility] ED by family due to new onset HA and right sided weakness.      Subjective Data      Prior Functioning       Cognition  Cognition Arousal/Alertness: Awake/alert Behavior During Therapy: WFL for tasks assessed/performed Overall Cognitive Status: Impaired/Different from baseline Area of Impairment: Memory;Safety/judgement;Awareness;Problem solving Memory: Decreased short-term memory Safety/Judgement: Decreased awareness of deficits Awareness: Anticipatory Problem Solving: Slow processing General Comments: Pt did not recognize Christmas decor. Pt's son asked if family celelbrates Christmas to ensure cultural sensitivity and to assess if this information is something patient should have prior knowledge of  Christmas decor. Pt does celebrates Christmas per son and this is an error unrecognized by patient Difficult to assess due to: Non-English speaking    Mobility  Bed Mobility Bed Mobility: Supine to Sit;Sitting - Scoot to Edge of Bed Supine to Sit: 5: Supervision Sitting - Scoot to Edge of Bed: 5:  Supervision Transfers Transfers: Sit to Stand;Stand to Sit Sit to Stand: 4: Min assist;From bed Stand to Sit: 4: Min guard;With upper extremity assist;To bed Details for Transfer Assistance: pt with posterior lean upon standing and using bil LE against bed surface for balance    Exercises      Balance     End of Session OT - End of Session Activity Tolerance: Patient tolerated treatment well Patient left: in bed;with call bell/phone within reach;with family/visitor present Nurse Communication: Mobility status;Precautions  GO     Tim [name] 07/01/2013, 2:06 PM Pager: [phone]

## 2013-07-01 NOTE — Progress Notes (Signed)
Family Medicine Teaching Service Daily Progress Note Intern Pager: [phone]  Patient name: Tim [name] Medical record number: [id number] Date of birth: Jul 21, 1941 Age: 72 y.o. Gender: male  Primary Care Provider: No primary provider on file. Consultants: Neurology Code Status: Full  Pt Overview and Major Events to Date:  12/5 - CT / MRI confirmed subacute non-hemorrhagic L-PCA infarct 12/6 - Recommend CIR, ECHO/Carotids (negative). Repeat EKG (normal) 12/9 - TEE (negative, without embolic source), pending CIR placement (no beds available 12/9, likely 12/10) 12/10 - No available CIR beds. Family agrees for DC to home with [facility] arrangements today.  Assessment and Plan: Tim [name] is a 72 y.o. [place] male presenting with left sided headache and right sided weakness and numbness found to have a subacute left PCA infarct. PMH is significant for previous ischemic stroke in 2012 with minimal f/up, untreated HTN, HLD.  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 No evidence of residual deficits per exam on admission apart from possible right sided numbness. However did not formally assess gait. Pt also with left sided hearing loss (but per family member this is chronic issue). Risk factors including smoking hx, HTN, HLD, no fmhx per family member. Elevated WBC 11.0 - MRI/MRA brain (Acute/subacute mod size nonhemorrhagic infarct L-temporal, L-occipital, L-thalamus) - ECHO, Carotids normal - frequent neuro checks - continue full dose ASA 325mg  daily for secondary CVA prevention - risk stratification labs- HgbA1c (5.8), Lipid panel (TC 216, TG 143, HDL 39, LDL 148), TSH (pending) - Atorvastatin 40mg  daily - per stroke team recommendations: requested TEE for further eval of embolic source - c/s Cardiology - EP  has seen pt 12/9 to proceed with TEE today. D/t evidence of non-sustained atrial tachy on tele, expect that will find evidence of AFib in next 30 days, proceed with 30 day monitor    - TEE (12/9) normal EF, no thrombus, negative without embolic source (no PFO)    - continue with 30 day event monitor, f/u cardiology 6 weeks to review, consider future loop monitor  # HTN - Improved Improved BP on Amlodipine. Remains stable 130-140s/80s Denies chest pain but ST depression noted on initial EKG without lead pattern, repeat EKG improved. Troponins (negative x3). CVA considered subacute, therefore outside range of permissive HTN. Suspect pt has long standing untreated HTN which is clearly a risk factor in this case - f/u BP, with goal < 180 / 105 - Amlodipine 10mg  daily  # Encephalopathy, suspected secondary to underlying vascular dementia d/t multiple CVAs Reported confusion by patient's son. Poor orientation, mostly only to person. Minimal orientation to date and place; however, unclear if these are known by pt at baseline, but talking with his sons they believe he is intermittently confused. Suspect due to subacute/chronic vascular dementia with significant h/o multiple CVAs. Additionally, hospital setting can contribute to acutely worsening encephalopathy - continue orienting patient daily  # AKI on CKD vs progression of CKD - 2 years ago Cr 1.6, suspect has CKD, currently improving 1.78--1.76 - tolerating PO so will KVO IVF - avoid nephrotoxic agents  # h/o Gout Treated with allopurinol at home  - will Hold due to elevated Cr  FEN/GI: heart healthy, passed bedside swallow study  Prophylaxis: SQ Heparin  Disposition: Initial recommendations from PT/OT for CIR placement vs SNF. Family decline SNF and CIR no beds available  x multiple days. Agreement for DC to home with [facility] arrangements (PT/OT, RN) and CSW who can monitor for any future needs of placement. Arranged f/u with Stroke Clinic in 2  months. Cardiology eval of external monitor in 6 weeks. Plan to discharge to home today.  Subjective: No acute events overnight. Spoke with patient with son as Nurse, learning disability, noted some concern of persistent Right leg weakness when pt was ambulating to bathroom by self. He continues to walk with nursing and PT/OT. Initial plan for CIR, however now due to limited availability family agrees to management at home with additional [facility] services.  Objective: Temp:  [97.3 F (36.3 C)-97.7 F (36.5 C)] 97.6 F (36.4 C) (12/10 1008) Pulse Rate:  [61-92] 63 (12/10 1008) Resp:  [8-23] 16 (12/10 1008) BP: (118-195)/(70-105) 131/77 mmHg (12/10 1008) SpO2:  [92 %-100 %] 96 % (12/10 1008) Physical Exam: General: sleeping, easily awaken by son, interviewed with assistance of Son as interpreter, NAD Cardiovascular: RRR, II/VI systolic murmur Respiratory: CTAB, normal work of breathing Abdomen: soft, NTND, +active BS Extremities: non-tender, no edema, +2 peripheral pulses Neuro: awake, alert, oriented to person, day, and place, CN-II-XII intact, exam grossly non-focal, unchanged muscle strength from prior exams, mild residual weakness 4/5 RUE, 5/5 bilateral LE, distal sensation to light touch intact w/ resolved R-numbness.  Laboratory:  Recent Labs Lab 06/26/13 1205 06/26/13 1940 06/27/13 0946  WBC 11.0* 10.6* 8.5  HGB 16.7 15.7 14.2  HCT 47.8 45.5 40.8  PLT 337 326 311    Recent Labs Lab 06/26/13 1205 06/26/13 1940 06/27/13 0946 06/29/13 0536  NA 139  --  141 138  K 3.7  --  3.6 3.5  CL 102  --  108 105  CO2 25  --  22 22  BUN 24*  --  26* 29*  CREATININE 2.01* 1.99* 1.78* 1.76*  CALCIUM 9.1  --  8.4 8.7  PROT 7.7  --   --   --   BILITOT 1.0  --   --   --   ALKPHOS 112  --   --   --   ALT 11  --   --   --   AST 15  --   --   --   GLUCOSE 100*  --  88 91   Troponin (negative x 3)  Imaging/Diagnostic Tests:  12/5 CXR IMPRESSION:  1. No acute cardiopulmonary abnormality.  2.  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 Subacute appearing left PCA infarct. Minimal to mild mass effect.  No associated hemorrhage.  2. Underlying advanced chronic small vessel ischemia.  12/5 MRI Brain / MRA Head IMPRESSION:  Some of the sequences are motion degraded.  Acute/ subacute moderate size nonhemorrhagic infarct medial left  temporal lobe, left occipital lobe and left thalamus.  Remote basal ganglia infarcts. Remote left caudate head infarct.  Remote right coronal radiata infarct. Remote infarct left genu of  the corpus callosum. Remote tiny left thalamic infarct. Small vessel  disease type changes.  Global atrophy without hydrocephalus.  No intracranial hemorrhage.  Non visualization left posterior cerebral artery consistent with  patient's acute infarct.  12/6 EKG NSR, HR 91, non-specific ST-depression  12/6 Repeat EKG NSR, HR 68, no acute findings, resolved ST-depression  12/6 2D ECHO Left ventricle: The cavity size was normal. Wall thickness was normal. Systolic function was normal. The estimated ejection fraction was in the range of 55% to 60%. Wall motion was normal; there  were no regional wall motion abnormalities. Doppler parameters are consistent with abnormal left ventricular relaxation (grade 1 diastolic dysfunction). - Aortic valve: Trileaflet; mildly thickened leaflets. Mild regurgitation. - Atrial septum: No defect or patent foramen ovale was Identified.  12/6 Carotid Dopplers Preliminary findings 1-39% ICA stenosis, Negative  12/9 TEE Normal EF 55-60%. No thrombus. No PFO. Negative for embolic source.  Saralyn Pilar, DO 07/01/2013, 1:22 PM PGY-1, [facility] Family Medicine FPTS Intern pager: [phone], text pages welcome

## 2013-07-02 ENCOUNTER — Other Ambulatory Visit: Payer: Self-pay | Admitting: Internal Medicine

## 2013-07-02 DIAGNOSIS — I639 Cerebral infarction, unspecified: Secondary | ICD-10-CM

## 2013-07-02 NOTE — Progress Notes (Signed)
I discussed with  Dr Karamalegos.  I agree with their plans documented in their progress note for today.  

## 2013-07-02 NOTE — Discharge Summary (Signed)
I discussed with  Dr Karamalegos.  I agree with their plans documented in their progress note for today.  

## 2013-07-29 ENCOUNTER — Encounter: Payer: Medicare Other | Admitting: Physician Assistant

## 2013-07-29 DIAGNOSIS — Z720 Tobacco use: Secondary | ICD-10-CM | POA: Insufficient documentation

## 2013-07-29 DIAGNOSIS — N183 Chronic kidney disease, stage 3 unspecified: Secondary | ICD-10-CM | POA: Insufficient documentation

## 2013-07-29 DIAGNOSIS — E785 Hyperlipidemia, unspecified: Secondary | ICD-10-CM | POA: Insufficient documentation

## 2013-07-29 DIAGNOSIS — I10 Essential (primary) hypertension: Secondary | ICD-10-CM | POA: Insufficient documentation

## 2013-07-29 DIAGNOSIS — I471 Supraventricular tachycardia: Secondary | ICD-10-CM | POA: Insufficient documentation

## 2013-07-29 NOTE — Progress Notes (Deleted)
  [address] [place], [place]  [address] Phone: [phone] Fax:  [phone]  Date:  07/29/2013   ID:  Tim [name], DOB 1941-04-15, MRN [id number]  PCP:  No primary provider on file.  Cardiologist:  Dr. Hillis RangeJames [name]     History of Present Illness: Tim [name] is a 73 y.o. male with a hx of largely untreated HTN and multiple prior ischemic strokes with poor follow up as well as CKD, HL, tobacco abuse.  Patient was recently admitted 06/2013 with a sub-acute L PCA stroke after presenting with HA and R sided weakness.  Carotid US (06/27/13): Bilateral 1-39%.  Echo (06/27/13): EF 55-60%, normal wall motion, grade 1 diastolic dysfunction, mild AI.  EF 55-60%, mild AI, trivial MR, no LAA clot, no RA clot.  Patient was evaluated by Dr. Johney FrameAllred for loop recorder implantation. Review of telemetry demonstrated a short run of atrial tachycardia. It was felt that the likelihood of capturing atrial fibrillation about 30 to monitor was high. Therefore, the patient was to be set up for an outpatient monitor after discharge.  The patient returns for follow up of his monitor today.  Unfortunately, the monitor was never obtained.    ***   Recent Labs: 06/26/2013: ALT 11; Pro B Natriuretic peptide (BNP) 175.8*  06/27/2013: HDL 39*; Hemoglobin 14.2; LDL (calc) 148*  06/29/2013: Creatinine 1.76*; Potassium 3.5   Wt Readings from Last 3 Encounters:  06/26/13 130 lb (58.968 kg)  06/26/13 130 lb (58.968 kg)  06/26/13 130 lb (58.968 kg)     Past Medical History  Diagnosis Date  . Hypertension     Current Outpatient Prescriptions  Medication Sig Dispense Refill  . allopurinol (ZYLOPRIM) 100 MG tablet Take 100 mg by mouth 3 (three) times daily as needed (for pain (gout flare)).      Marland Kitchen. amLODipine (NORVASC) 10 MG tablet Take 1 tablet (10 mg total) by mouth daily.  30 tablet  6  . aspirin 325 MG tablet Take 1 tablet (325 mg total) by mouth daily.  30 tablet  6  . atorvastatin (LIPITOR) 40 MG tablet Take 1  tablet (40 mg total) by mouth daily.  90 tablet  3  . naproxen (NAPROSYN) 500 MG tablet Take 500 mg by mouth 2 (two) times daily as needed (for pain).       No current facility-administered medications for this visit.    Allergies:   Review of patient's allergies indicates no known allergies.   Social History:  The patient  reports that he has been smoking.  He does not have any smokeless tobacco history on file. He reports that he does not drink alcohol.   Family History:  The patient's family history is not on file.   ROS:  Please see the history of present illness.   ***   All other systems reviewed and negative.   PHYSICAL EXAM: VS:  There were no vitals taken for this visit. Well nourished, well developed, in no acute distress HEENT: normal Neck: no JVD Cardiac:  normal S1, S2; RRR; no murmur Lungs:  clear to auscultation bilaterally, no wheezing, rhonchi or rales Abd: soft, nontender, no hepatomegaly Ext: no edema Skin: warm and dry Neuro:  CNs 2-12 intact, no focal abnormalities noted  EKG:  ***     ASSESSMENT AND PLAN:  1. ***  Signed, Tereso NewcomerScott Eliyanna Ault, PA-C  07/29/2013 8:22 AM

## 2013-07-29 NOTE — Progress Notes (Signed)
This encounter was created in error - please disregard.

## 2013-08-11 ENCOUNTER — Encounter: Payer: Medicare Other | Admitting: Physician Assistant

## 2013-08-11 ENCOUNTER — Encounter: Payer: Self-pay | Admitting: Physician Assistant

## 2017-11-27 ENCOUNTER — Encounter (HOSPITAL_COMMUNITY): Payer: Self-pay

## 2017-11-27 ENCOUNTER — Emergency Department (HOSPITAL_COMMUNITY)
Admission: EM | Admit: 2017-11-27 | Discharge: 2017-11-27 | Discharge status: Against Medical Advice | Payer: Medicare Other | Attending: Emergency Medicine | Admitting: Emergency Medicine

## 2017-11-27 ENCOUNTER — Emergency Department (HOSPITAL_COMMUNITY): Payer: Medicare Other

## 2017-11-27 ENCOUNTER — Other Ambulatory Visit: Payer: Self-pay

## 2017-11-27 DIAGNOSIS — F0391 Unspecified dementia with behavioral disturbance: Secondary | ICD-10-CM | POA: Insufficient documentation

## 2017-11-27 DIAGNOSIS — I129 Hypertensive chronic kidney disease with stage 1 through stage 4 chronic kidney disease, or unspecified chronic kidney disease: Secondary | ICD-10-CM | POA: Insufficient documentation

## 2017-11-27 DIAGNOSIS — G309 Alzheimer's disease, unspecified: Secondary | ICD-10-CM | POA: Diagnosis not present

## 2017-11-27 DIAGNOSIS — Z5320 Procedure and treatment not carried out because of patient's decision for unspecified reasons: Secondary | ICD-10-CM | POA: Insufficient documentation

## 2017-11-27 DIAGNOSIS — N183 Chronic kidney disease, stage 3 (moderate): Secondary | ICD-10-CM | POA: Diagnosis not present

## 2017-11-27 DIAGNOSIS — Z8673 Personal history of transient ischemic attack (TIA), and cerebral infarction without residual deficits: Secondary | ICD-10-CM | POA: Insufficient documentation

## 2017-11-27 DIAGNOSIS — R4182 Altered mental status, unspecified: Secondary | ICD-10-CM | POA: Diagnosis present

## 2017-11-27 DIAGNOSIS — Z87891 Personal history of nicotine dependence: Secondary | ICD-10-CM | POA: Insufficient documentation

## 2017-11-27 HISTORY — DX: Gout, unspecified: M10.9

## 2017-11-27 LAB — URINALYSIS, ROUTINE W REFLEX MICROSCOPIC
BACTERIA UA: NONE SEEN
Bilirubin Urine: NEGATIVE
GLUCOSE, UA: NEGATIVE mg/dL
KETONES UR: NEGATIVE mg/dL
Leukocytes, UA: NEGATIVE
Nitrite: NEGATIVE
Protein, ur: 100 mg/dL — AB
SPECIFIC GRAVITY, URINE: 1.013 (ref 1.005–1.030)
pH: 5 (ref 5.0–8.0)

## 2017-11-27 LAB — DIFFERENTIAL
BASOS ABS: 0 10*3/uL (ref 0.0–0.1)
Basophils Relative: 0 %
Eosinophils Absolute: 0.2 10*3/uL (ref 0.0–0.7)
Eosinophils Relative: 2 %
LYMPHS ABS: 2.2 10*3/uL (ref 0.7–4.0)
Lymphocytes Relative: 24 %
MONOS PCT: 8 %
Monocytes Absolute: 0.8 10*3/uL (ref 0.1–1.0)
NEUTROS ABS: 6.2 10*3/uL (ref 1.7–7.7)
Neutrophils Relative %: 66 %

## 2017-11-27 LAB — I-STAT CHEM 8, ED
BUN: 24 mg/dL — ABNORMAL HIGH (ref 6–20)
CREATININE: 2.1 mg/dL — AB (ref 0.61–1.24)
Calcium, Ion: 1.14 mmol/L — ABNORMAL LOW (ref 1.15–1.40)
Chloride: 106 mmol/L (ref 101–111)
Glucose, Bld: 121 mg/dL — ABNORMAL HIGH (ref 65–99)
HCT: 45 % (ref 39.0–52.0)
HEMOGLOBIN: 15.3 g/dL (ref 13.0–17.0)
POTASSIUM: 3.4 mmol/L — AB (ref 3.5–5.1)
Sodium: 143 mmol/L (ref 135–145)
TCO2: 23 mmol/L (ref 22–32)

## 2017-11-27 LAB — COMPREHENSIVE METABOLIC PANEL
ALT: 15 U/L — ABNORMAL LOW (ref 17–63)
AST: 18 U/L (ref 15–41)
Albumin: 3.2 g/dL — ABNORMAL LOW (ref 3.5–5.0)
Alkaline Phosphatase: 71 U/L (ref 38–126)
Anion gap: 8 (ref 5–15)
BUN: 20 mg/dL (ref 6–20)
CO2: 26 mmol/L (ref 22–32)
Calcium: 8.8 mg/dL — ABNORMAL LOW (ref 8.9–10.3)
Chloride: 108 mmol/L (ref 101–111)
Creatinine, Ser: 2.11 mg/dL — ABNORMAL HIGH (ref 0.61–1.24)
GFR calc Af Amer: 33 mL/min — ABNORMAL LOW (ref >60)
GFR calc non Af Amer: 29 mL/min — ABNORMAL LOW (ref >60)
Glucose, Bld: 126 mg/dL — ABNORMAL HIGH (ref 65–99)
Potassium: 3.5 mmol/L (ref 3.5–5.1)
Sodium: 142 mmol/L (ref 135–145)
Total Bilirubin: 1.1 mg/dL (ref 0.3–1.2)
Total Protein: 6.8 g/dL (ref 6.5–8.1)

## 2017-11-27 LAB — APTT: aPTT: 33 seconds (ref 24–36)

## 2017-11-27 LAB — CBC
HEMATOCRIT: 43 % (ref 39.0–52.0)
HEMOGLOBIN: 14.4 g/dL (ref 13.0–17.0)
MCH: 30.1 pg (ref 26.0–34.0)
MCHC: 33.5 g/dL (ref 30.0–36.0)
MCV: 89.8 fL (ref 78.0–100.0)
Platelets: 288 10*3/uL (ref 150–400)
RBC: 4.79 MIL/uL (ref 4.22–5.81)
RDW: 13.8 % (ref 11.5–15.5)
WBC: 9.5 10*3/uL (ref 4.0–10.5)

## 2017-11-27 LAB — I-STAT TROPONIN, ED: TROPONIN I, POC: 0.01 ng/mL (ref 0.00–0.08)

## 2017-11-27 LAB — PROTIME-INR
INR: 1.03
Prothrombin Time: 13.4 seconds (ref 11.4–15.2)

## 2017-11-27 NOTE — ED Triage Notes (Signed)
Pt here with son for altered mental status. Pt's son states that he has been losing his memory x 4 years. The pt traveled to [place] and stayed there for 3 years and came back 1 week ago unable to walk and completely disoriented. Pt went to hospital in [place] and could not find a reason for pt's sx. Hypertensive in triage and has hx of same, not taking medications.

## 2017-11-27 NOTE — Care Management (Signed)
ED CM consulted concerning patient recommendations for [facility] services. ED CM and ED CSW reviewed patient's record. Patient apparently returned from [place] several day ago, after being there for 3 years. The  patient's family reports that when patient returned he was unable to ambulate and was confused. Hx of CVA in the past. Patient does not speak English son tells me he took him to Urgent Care yesterday and was told to bring him to the ED for evaluation   CM discussed the possibility of [facility] services, if no significant findings are noted. Son verbalized understand. CM noted that patient does not have a PCP, and has not seen anyone in the area since 2015.  Patient would need to have a PCP for [facility] services. ED CM discussed with EDP, when explained to son he became somewhat agitated and decided he was not interested in [facility] services or in having his father stay in ED any longer.  Son wants to take patient home at this time. Ebbie Ridge PA-C made aware.

## 2017-11-27 NOTE — ED Notes (Addendum)
Pt and son left AMA. Son verbalized understanding about following up with PCP this week.  PA Lawyer aware.

## 2017-11-27 NOTE — ED Notes (Signed)
Pt son said he wants to leave and take pt home.  I told him that Dr. Otila Kluver would be in to see him shortly and advised him against leaving AMA.

## 2017-11-27 NOTE — Progress Notes (Signed)
ED CSW and CM met with pt and pt's son at bedside. CM discussed pt obtaining a PCP. Pt's son asked for medication to fix pt. Explained the importance of pt finding PCP to follow pt. Pt's son requested to leave AMA.   Wendelyn Breslow, [facility]  [phone]

## 2017-11-27 NOTE — ED Notes (Addendum)
Pt and son spoken to by Darnelle Going and PA Lawyer about needing to stay in the hospital. Pt requests to leave AMA. Will notify PA Lawyer about leaving AMA.

## 2017-11-29 NOTE — ED Provider Notes (Signed)
[facility] EMERGENCY DEPARTMENT Provider Note   CSN: [id number] Arrival date & time: 11/27/17  1558     History   Chief Complaint Chief Complaint  Patient presents with  . Altered Mental Status    HPI Tim [name] is a 77 y.o. male.  HPI Patient presents to the emergency department with inability to ambulate and altered mental status.  The patient was recently over the last week brought back to the [place] from [place].  The patient left shortly after his stroke in 2014 and lived there up until a week ago.  The son states that he does not know who he is and that his father was unable to walk.  Son states that this started in around 2015 but is gotten worse.  He is unable to care for himself at all.  The patient is unable to give me any history. Past Medical History:  Diagnosis Date  . Gout   . Hypertension     Patient Active Problem List   Diagnosis Date Noted  . HTN (hypertension) 07/29/2013  . CKD (chronic kidney disease) stage 3, GFR 30-59 ml/min (HCC) 07/29/2013  . Tobacco abuse 07/29/2013  . HLD (hyperlipidemia) 07/29/2013  . Atrial tachycardia (HCC) 07/29/2013  . Cognitive impairment 06/29/2013  . CVA (cerebral infarction) 06/26/2013  . Stroke [facility]) 06/26/2013    Past Surgical History:  Procedure Laterality Date  . TEE WITHOUT CARDIOVERSION N/A 06/30/2013   Procedure: TRANSESOPHAGEAL ECHOCARDIOGRAM (TEE);  Surgeon: Laurey Morale, MD;  [facility]: [facility] ENDOSCOPY;  Service: Cardiovascular;  Laterality: N/A;  interpreting service set up by [facility]        Home Medications    Prior to Admission medications   Medication Sig Start Date End Date Taking? Authorizing Provider  acetaminophen (TYLENOL) 325 MG tablet Take 650 mg by mouth every 6 (six) hours as needed.   Yes [provider]  amLODipine (NORVASC) 10 MG tablet Take 1 tablet (10 mg total) by mouth daily. Patient not taking: Reported on 11/27/2017 07/01/13   Smitty Cords, DO    aspirin 325 MG tablet Take 1 tablet (325 mg total) by mouth daily. Patient not taking: Reported on 11/27/2017 07/01/13   Smitty Cords, DO  atorvastatin (LIPITOR) 40 MG tablet Take 1 tablet (40 mg total) by mouth daily. Patient not taking: Reported on 11/27/2017 07/01/13   Smitty Cords, DO    Family History History reviewed. No pertinent family history.  Social History Social History   Tobacco Use  . Smoking status: Former Smoker    Last attempt to quit: 11/28/2014    Years since quitting: 3.0  Substance Use Topics  . Alcohol use: No  . Drug use: Never     Allergies   Patient has no known allergies.   Review of Systems Review of Systems  Level 5 caveat applies due to dementia and altered mental status Physical Exam Updated Vital Signs BP (!) 164/92   Pulse (!) 55   Temp 98 F (36.7 C)   Resp (!) 26   Ht  (1.778 m)   Wt 65.8 kg (145 lb)   SpO2 100%   BMI 20.81 kg/m   Physical Exam  Constitutional: He appears well-developed and well-nourished. No distress.  HENT:  Head: Normocephalic and atraumatic.  Mouth/Throat: Oropharynx is clear and moist.  Eyes: Pupils are equal, round, and reactive to light.  Neck: Normal range of motion. Neck supple.  Cardiovascular: Normal rate, regular rhythm and normal heart  sounds. Exam reveals no gallop and no friction rub.  No murmur heard. Pulmonary/Chest: Effort normal and breath sounds normal. No respiratory distress. He has no wheezes.  Abdominal: Soft. Bowel sounds are normal. He exhibits no distension. There is no tenderness.  Neurological: He is alert. He exhibits normal muscle tone. Coordination normal.  Skin: Skin is warm and dry. Capillary refill takes less than 2 seconds. No rash noted. No erythema.  Psychiatric: He has a normal mood and affect. His behavior is normal.  Nursing note and vitals reviewed.    ED Treatments / Results  Labs (all labs ordered are listed, but only abnormal results  are displayed) Labs Reviewed  COMPREHENSIVE METABOLIC PANEL - Abnormal; Notable for the following components:      Result Value   Glucose, Bld 126 (*)    Creatinine, Ser 2.11 (*)    Calcium 8.8 (*)    Albumin 3.2 (*)    ALT 15 (*)    GFR calc non Af Amer 29 (*)    GFR calc Af Amer 33 (*)    All other components within normal limits  URINALYSIS, ROUTINE W REFLEX MICROSCOPIC - Abnormal; Notable for the following components:   Hgb urine dipstick SMALL (*)    Protein, ur 100 (*)    All other components within normal limits  I-STAT CHEM 8, ED - Abnormal; Notable for the following components:   Potassium 3.4 (*)    BUN 24 (*)    Creatinine, Ser 2.10 (*)    Glucose, Bld 121 (*)    Calcium, Ion 1.14 (*)    All other components within normal limits  PROTIME-INR  APTT  CBC  DIFFERENTIAL  I-STAT TROPONIN, ED    EKG EKG Interpretation  Date/Time:  Wednesday Nov 27 2017 16:08:16 EDT Ventricular Rate:  79 PR Interval:  150 QRS Duration: 82 QT Interval:  372 QTC Calculation: 426 R Axis:   48 Text Interpretation:  Sinus rhythm Baseline wander Normal ECG Confirmed by Geoffery Lyons ([id number]) on 11/29/2017 12:12:27 AM   Radiology Ct Head Wo Contrast  Result Date: 11/27/2017 CLINICAL DATA:  77 year old with altered mental status. EXAM: CT HEAD WITHOUT CONTRAST TECHNIQUE: Contiguous axial images were obtained from the base of the skull through the vertex without intravenous contrast. COMPARISON:  Brain MRI 06/26/2013.  Head CT 06/26/2013 FINDINGS: Brain: Chronic encephalomalacia in the posterior left temporal lobe and left occipital lobe. Evidence for old infarct in the left frontal white matter and left basal ganglia. Again noted is diffuse cerebral atrophy. Ventricles have slightly enlarged in size and may be related to increased atrophy. There appears to be new encephalomalacia along the posterior right temporal lobe best seen on sequence 6 image 9. No evidence for acute hemorrhage, mass  lesion, midline shift, hydrocephalus or an acute infarct. Vascular: No hyperdense vessel or unexpected calcification. Skull: Normal. Negative for fracture or focal lesion. Sinuses/Orbits: No acute finding. Other: None. IMPRESSION: No acute intracranial abnormality. Diffuse cerebral atrophy and evidence for multiple old infarcts. Evidence for chronic small vessel ischemic disease. Electronically Signed   By: Richarda Overlie M.D.   On: 11/27/2017 17:56    Procedures Procedures (including critical care time)  Medications Ordered in ED Medications - No data to display   Initial Impression / Assessment and Plan / ED Course  I have reviewed the triage vital signs and the nursing notes.  Pertinent labs & imaging results that were available during my care of the patient were reviewed by me and  considered in my medical decision making (see chart for details).     I advised the son that the patient is going to need placement into a facility.  At this point it was questionable whether there was an admittable diagnosis and I had case Production designer, theatre/television/film and social work speak with the son.  The son requested to leave AGAINST MEDICAL ADVICE.  We did advise him that he would need this return for any worsening in his condition.  The patient has been stable during his visit here in the emergency department.  It sounds as though the patient has severe cognitive impairment due to dementia or Alzheimer's from his stroke. Final Clinical Impressions(s) / ED Diagnoses   Final diagnoses:  None    ED Discharge Orders    None       Charlestine Night, PA-C 12/01/17 1323    Wynetta Fines, MD 12/02/17 1152

## 2019-11-04 IMAGING — CT CT HEAD W/O CM
5 series · 16 of 47 positions shown, 18 images · non-contrast
Comparison: Brain MRI 06/26/2013.  Head CT 06/26/2013

CLINICAL DATA: 76-year-old with altered mental status.

EXAM:
CT HEAD WITHOUT CONTRAST
TECHNIQUE: Contiguous axial images were obtained from the base of the skull
through the vertex without intravenous contrast.

[Series 3: head without · axial · non-contrast · 0.43mm/px · z∈[-39,+56]mm · 5 of 29 slices shown, 7 images (1 of 2)]
[im 5/29  brain]
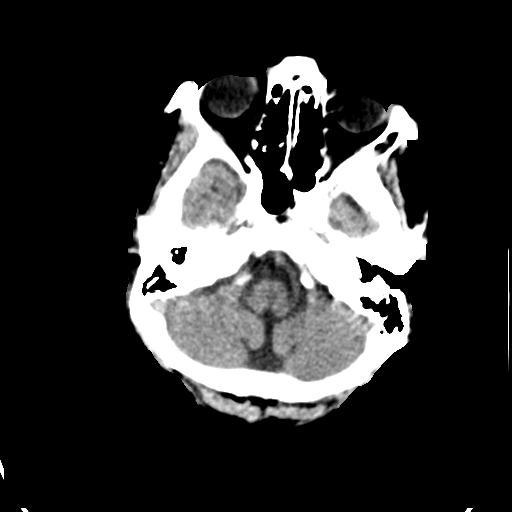
[im 5/29  bone]
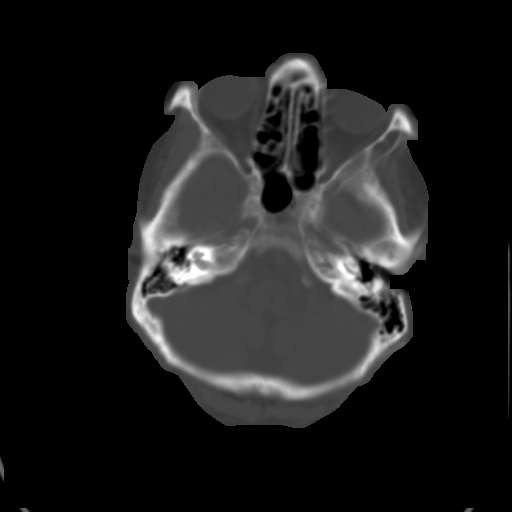
[im 10/29  brain]
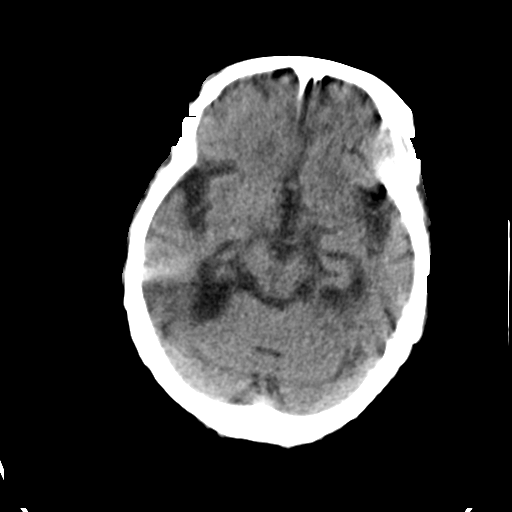
[im 15/29  brain]
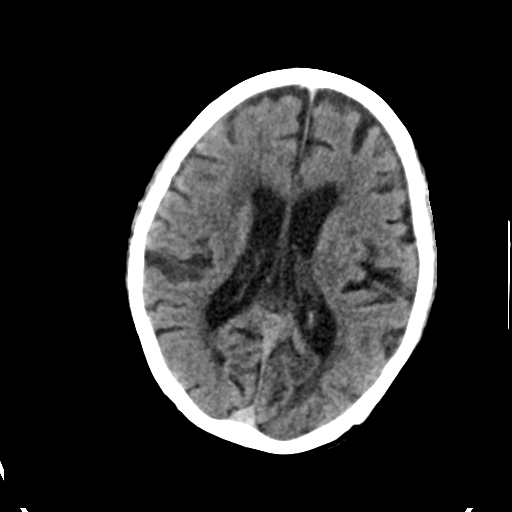
[im 19/29  brain]
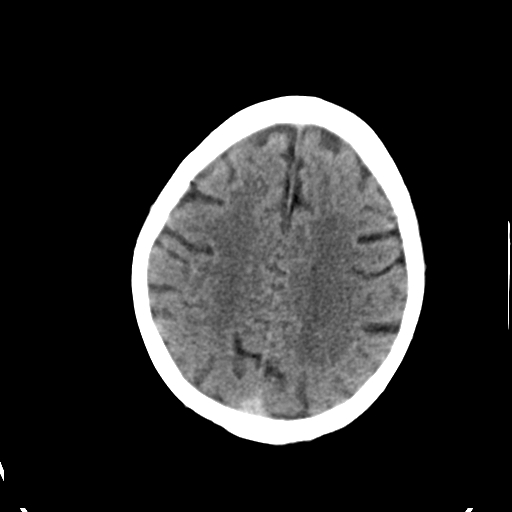
[im 24/29  brain]
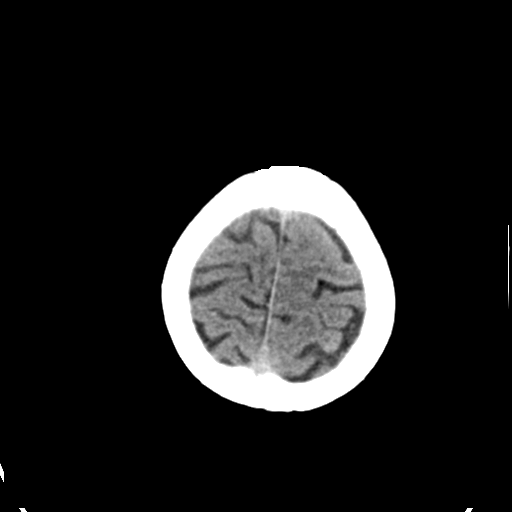
[im 24/29  bone]
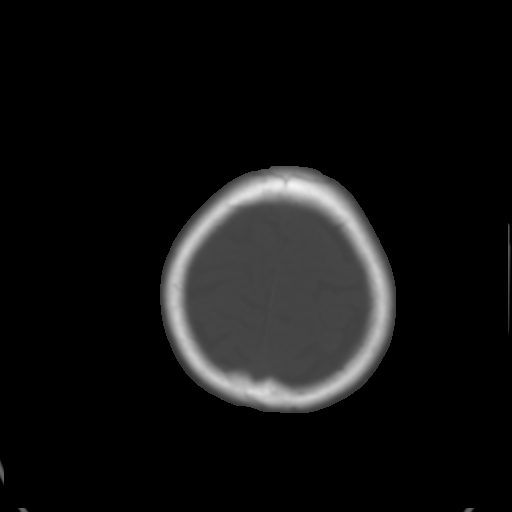

[Series 4: head bone · axial · 0.43mm/px · z∈[-43,-27]mm · 2 of 71 slices shown]
[im 9/71  bone]
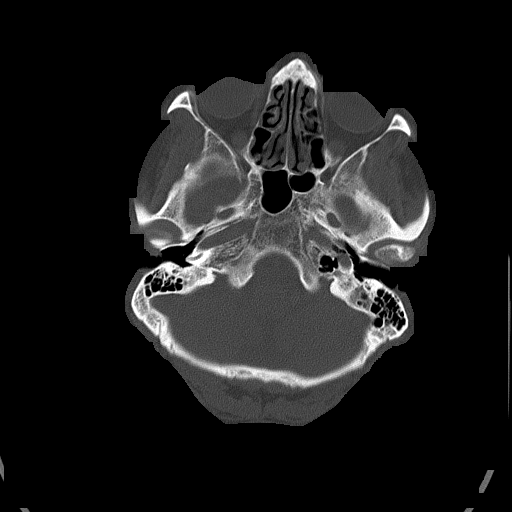
[im 17/71  bone]
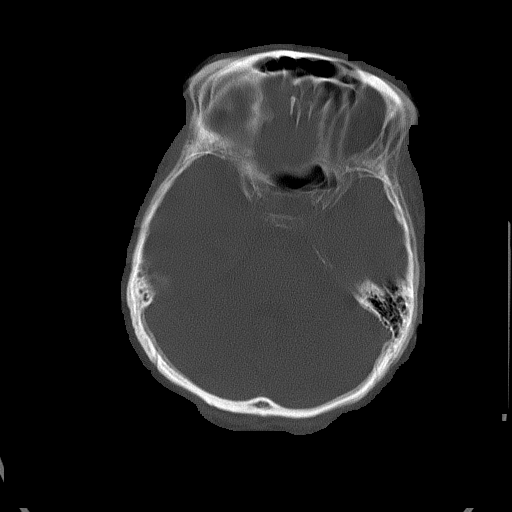

[Series 5: head without sag · sagittal · non-contrast · 0.28mm/px · 3 of 54 slices shown]
[im 18/54  brain]
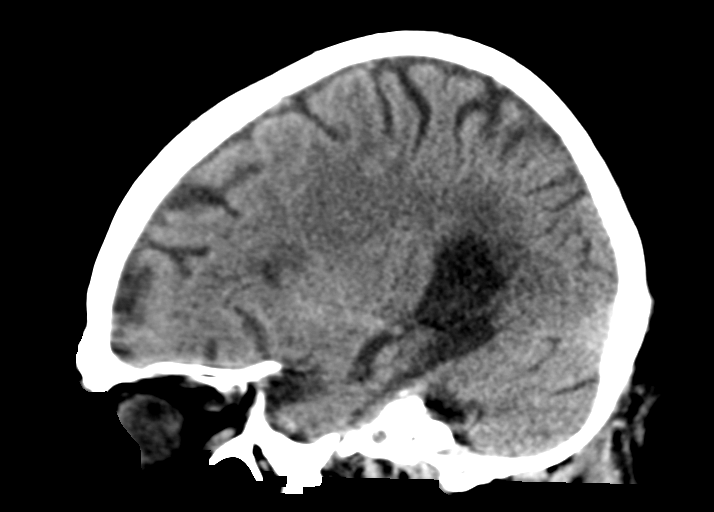
[im 27/54  brain]
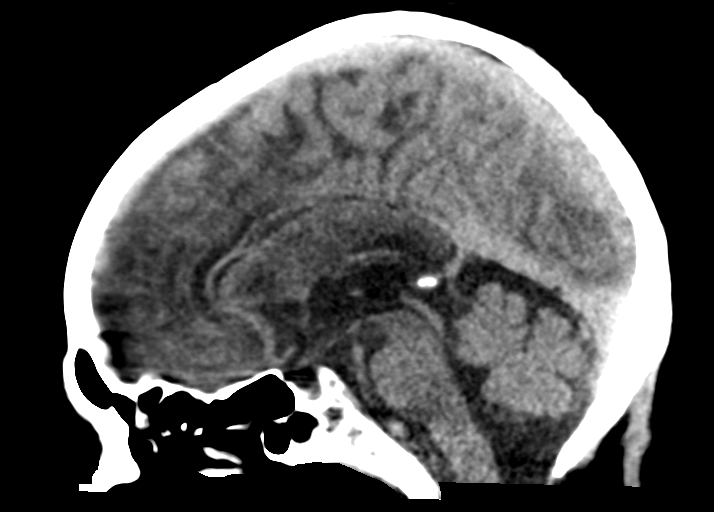
[im 36/54  brain]
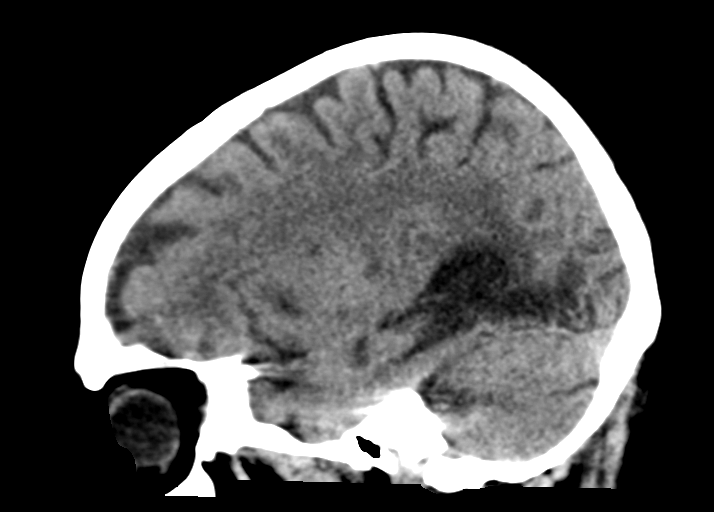

[Series 6: head without · axial · non-contrast · 0.43mm/px · z∈[-39,+11]mm · 3 of 20 slices shown (2 of 2)]
[im 5/20  brain]
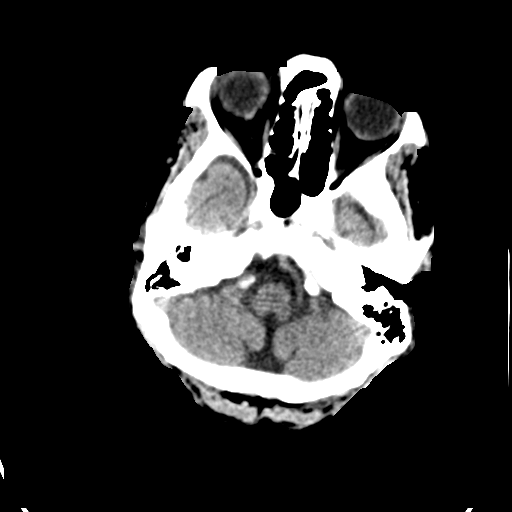
[im 10/20  brain]
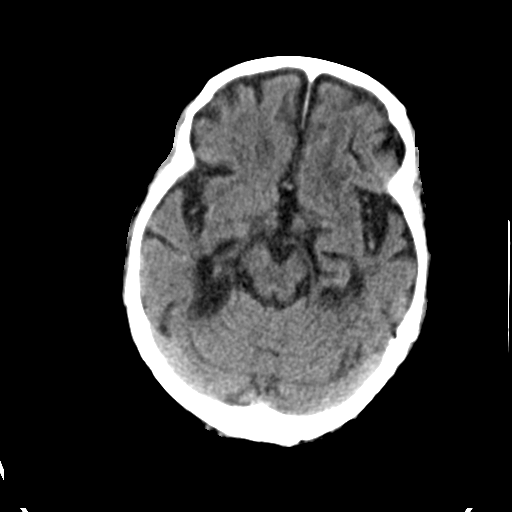
[im 15/20  brain]
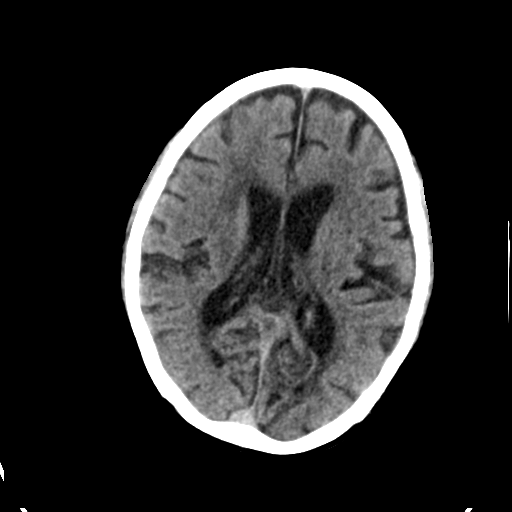

[Series 7: head without cor · coronal · non-contrast · 0.28mm/px · 3 of 67 slices shown]
[im 23/67  brain]
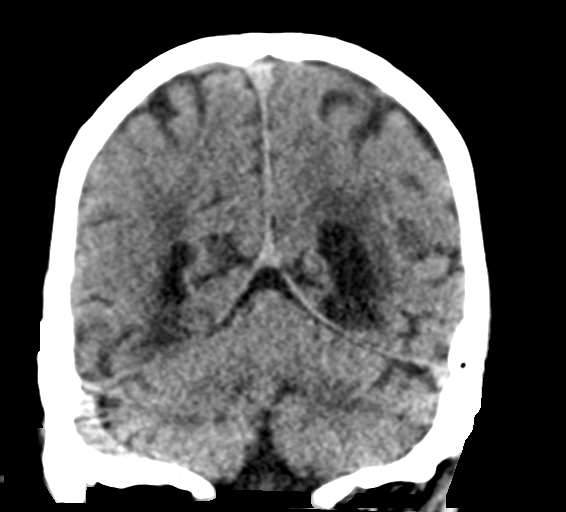
[im 30/67  brain]
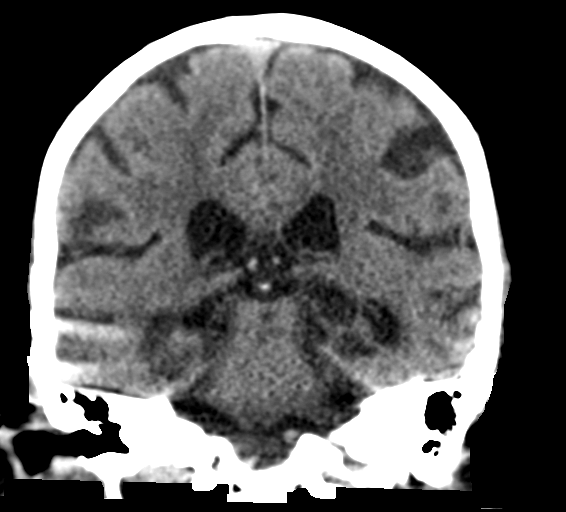
[im 37/67  brain]
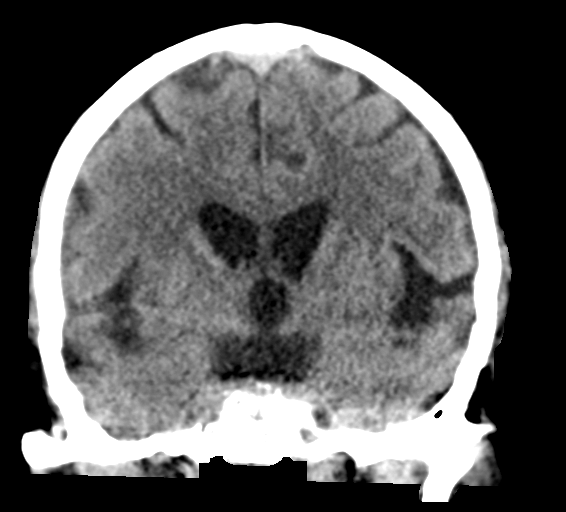

[16 of 47 positions shown; findings below may reference images not displayed]

FINDINGS: Brain: Chronic encephalomalacia in the posterior left temporal lobe
and left occipital lobe. Evidence for old infarct in the left
frontal white matter and left basal ganglia. Again noted is diffuse
cerebral atrophy. Ventricles have slightly enlarged in size and may
be related to increased atrophy. There appears to be new
encephalomalacia along the posterior right temporal lobe best seen
on sequence 6 image 9. No evidence for acute hemorrhage, mass
lesion, midline shift, hydrocephalus or an acute infarct.

Vascular: No hyperdense vessel or unexpected calcification.

Skull: Normal. Negative for fracture or focal lesion.

Sinuses/Orbits: No acute finding.

Other: None.
IMPRESSION: No acute intracranial abnormality.

Diffuse cerebral atrophy and evidence for multiple old infarcts.
Evidence for chronic small vessel ischemic disease.
